# Patient Record
Sex: Male | Born: 1940 | Race: Black or African American | Hispanic: No | Marital: Married | Smoking: Never smoker
Health system: Southern US, Community
[De-identification: ages and names within clinical notes are randomized; demographics above are authoritative.]

## PROBLEM LIST (undated history)

## (undated) DIAGNOSIS — Z923 Personal history of irradiation: Secondary | ICD-10-CM

---

## 2013-07-01 ENCOUNTER — Encounter (HOSPITAL_COMMUNITY): Payer: Self-pay | Admitting: Emergency Medicine

## 2013-07-01 ENCOUNTER — Observation Stay (HOSPITAL_COMMUNITY)
Admission: EM | Admit: 2013-07-01 | Discharge: 2013-07-03 | DRG: 689 | Disposition: A | Discharge status: Home Health | Payer: Medicare Other | Attending: Internal Medicine | Admitting: Internal Medicine

## 2013-07-01 ENCOUNTER — Emergency Department (HOSPITAL_COMMUNITY): Payer: Medicare Other

## 2013-07-01 DIAGNOSIS — Z79899 Other long term (current) drug therapy: Secondary | ICD-10-CM | POA: Insufficient documentation

## 2013-07-01 DIAGNOSIS — J209 Acute bronchitis, unspecified: Secondary | ICD-10-CM | POA: Diagnosis present

## 2013-07-01 DIAGNOSIS — N390 Urinary tract infection, site not specified: Secondary | ICD-10-CM | POA: Insufficient documentation

## 2013-07-01 DIAGNOSIS — N179 Acute kidney failure, unspecified: Secondary | ICD-10-CM | POA: Diagnosis present

## 2013-07-01 DIAGNOSIS — R4189 Other symptoms and signs involving cognitive functions and awareness: Secondary | ICD-10-CM | POA: Diagnosis present

## 2013-07-01 DIAGNOSIS — G9341 Metabolic encephalopathy: Principal | ICD-10-CM | POA: Insufficient documentation

## 2013-07-01 DIAGNOSIS — R4182 Altered mental status, unspecified: Secondary | ICD-10-CM | POA: Diagnosis present

## 2013-07-01 DIAGNOSIS — D72829 Elevated white blood cell count, unspecified: Secondary | ICD-10-CM | POA: Diagnosis present

## 2013-07-01 DIAGNOSIS — Z23 Encounter for immunization: Secondary | ICD-10-CM | POA: Insufficient documentation

## 2013-07-01 DIAGNOSIS — R0602 Shortness of breath: Secondary | ICD-10-CM | POA: Diagnosis present

## 2013-07-01 LAB — COMPREHENSIVE METABOLIC PANEL
ALT: 15 U/L (ref 0–53)
Albumin: 3.8 g/dL (ref 3.5–5.2)
BUN: 23 mg/dL (ref 6–23)
CO2: 27 mEq/L (ref 19–32)
Calcium: 8.4 mg/dL (ref 8.4–10.5)
Creatinine, Ser: 1.83 mg/dL — ABNORMAL HIGH (ref 0.50–1.35)
GFR calc Af Amer: 41 mL/min — ABNORMAL LOW (ref >90)
GFR calc non Af Amer: 35 mL/min — ABNORMAL LOW (ref >90)
Glucose, Bld: 136 mg/dL — ABNORMAL HIGH (ref 70–99)

## 2013-07-01 LAB — CBC
HCT: 40.6 % (ref 39.0–52.0)
Hemoglobin: 14.6 g/dL (ref 13.0–17.0)
MCH: 32.7 pg (ref 26.0–34.0)
MCHC: 36 g/dL (ref 30.0–36.0)
MCV: 91 fL (ref 78.0–100.0)
RBC: 4.46 MIL/uL (ref 4.22–5.81)

## 2013-07-01 LAB — CG4 I-STAT (LACTIC ACID): Lactic Acid, Venous: 1.09 mmol/L (ref 0.5–2.2)

## 2013-07-01 MED ORDER — ACETAMINOPHEN 650 MG RE SUPP
650.0000 mg | Freq: Once | RECTAL | Status: AC
  Administered 2013-07-01: 650 mg via RECTAL
  Filled 2013-07-01: qty 1

## 2013-07-01 MED ORDER — DEXTROSE 5 % IV SOLN
1.0000 g | Freq: Once | INTRAVENOUS | Status: AC
  Administered 2013-07-01: 1 g via INTRAVENOUS
  Filled 2013-07-01: qty 10

## 2013-07-01 MED ORDER — SODIUM CHLORIDE 0.9 % IV BOLUS (SEPSIS)
1000.0000 mL | Freq: Once | INTRAVENOUS | Status: AC
  Administered 2013-07-01: 1000 mL via INTRAVENOUS

## 2013-07-01 MED ORDER — DEXTROSE 5 % IV SOLN
500.0000 mg | Freq: Once | INTRAVENOUS | Status: AC
  Administered 2013-07-02: 500 mg via INTRAVENOUS
  Filled 2013-07-01: qty 500

## 2013-07-01 MED ORDER — CEFTRIAXONE SODIUM 1 G IJ SOLR
1.0000 g | Freq: Once | INTRAMUSCULAR | Status: DC
  Filled 2013-07-01: qty 10

## 2013-07-01 NOTE — ED Notes (Signed)
Patient from home via [facility] with altered mental status.  Per patient's wife patient has had congestion with an unproductive cough since yesterday morning.  Wife states that patient has also been having urinary frequency that started last night.  Patient has been rambling incoherent phrases but alert to self, patient able to follow simple commands.  Wife states that patient has become more forgetful in the last 6 months.   Patient also traveled to [place] recently returning on 06/25/2013.

## 2013-07-01 NOTE — ED Provider Notes (Signed)
CSN: [id number]     Arrival date & time 07/01/13  2132 History   First MD Initiated Contact with Patient 07/01/13 2306     Chief Complaint  Patient presents with  . Altered Mental Status  . Diarrhea   (Consider location/radiation/quality/duration/timing/severity/associated sxs/prior Treatment) HPI History provided by patient and wife bedside. Cough, and congestion with productive sputum since yesterday and a fever tonight. The fever has become confused, incoherent speech and not acting himself. No rash. No abdominal pain or vomiting. Has had some loose stools tonight. No blood in stools. No chest pain. Patient denies any shortness of breath. He has had some urinary frequency since last night denies any dysuria or hematuria. No history of same. He denies any medical problems does not take any prescribed medications. Followed by Dr. Ricki Miller. Symptoms moderate to severe.  History reviewed. No pertinent past medical history. History reviewed. No pertinent past surgical history. No family history on file. History  Substance Use Topics  . Smoking status: Unknown If Ever Smoked  . Smokeless tobacco: Not on file  . Alcohol Use: Not on file    Review of Systems  Constitutional: Positive for fever and chills.  HENT: Negative for neck pain and neck stiffness.   Eyes: Negative for visual disturbance.  Respiratory: Positive for cough. Negative for shortness of breath.   Cardiovascular: Negative for chest pain.  Gastrointestinal: Negative for vomiting, abdominal pain and diarrhea.  Genitourinary: Positive for frequency.  Musculoskeletal: Negative for back pain.  Skin: Negative for rash and wound.  Neurological: Negative for seizures, syncope and headaches.  All other systems reviewed and are negative.    Allergies  Review of patient's allergies indicates no known allergies.  Home Medications   Current Outpatient Rx  Name  Route  Sig  Dispense  Refill  . guaiFENesin (MUCINEX) 600 MG 12 hr  tablet   Oral   Take 1,200 mg by mouth 2 (two) times daily.          BP 109/50  Pulse 79  Temp(Src) 101.4 F (38.6 C) (Rectal)  Resp 17  SpO2 96% Physical Exam  Constitutional: He appears well-developed and well-nourished.  HENT:  Head: Normocephalic and atraumatic.  Mouth/Throat: Oropharynx is clear and moist. No oropharyngeal exudate.  Eyes: EOM are normal. Pupils are equal, round, and reactive to light. No scleral icterus.  Neck: Normal range of motion. Neck supple.  Cardiovascular: Normal rate, regular rhythm and intact distal pulses.   Pulmonary/Chest: Effort normal and breath sounds normal. No respiratory distress. He exhibits no tenderness.  Abdominal: Soft. He exhibits no distension. There is no tenderness.  Musculoskeletal: Normal range of motion. He exhibits no edema and no tenderness.  Neurological:  Awake and alert with slow speech, slightly confused, follows commands, no focal deficits otherwise  Skin: Skin is warm and dry.    ED Course  Procedures (including critical care time) Labs Review Labs Reviewed  URINALYSIS, ROUTINE W REFLEX MICROSCOPIC - Abnormal; Notable for the following:    Protein, ur 100 (*)    All other components within normal limits  CBC - Abnormal; Notable for the following:    WBC 11.0 (*)    Platelets 145 (*)    All other components within normal limits  COMPREHENSIVE METABOLIC PANEL - Abnormal; Notable for the following:    Glucose, Bld 136 (*)    Creatinine, Ser 1.83 (*)    GFR calc non Af Amer 35 (*)    GFR calc Af Amer 41 (*)  All other components within normal limits  URINE MICROSCOPIC-ADD ON - Abnormal; Notable for the following:    Squamous Epithelial / LPF FEW (*)    Bacteria, UA FEW (*)    Casts HYALINE CASTS (*)    All other components within normal limits  GRAM STAIN  URINE CULTURE  CULTURE, BLOOD (ROUTINE X 2)  CULTURE, BLOOD (ROUTINE X 2)  CULTURE, EXPECTORATED SPUTUM-ASSESSMENT  STREP PNEUMONIAE URINARY ANTIGEN   LEGIONELLA ANTIGEN, URINE  TSH  BASIC METABOLIC PANEL  CBC  CG4 I-STAT (LACTIC ACID)   Imaging Review Dg Chest Port 1 View  07/01/2013   CLINICAL DATA:  Chest congestion with fever. Disorientation.  EXAM: PORTABLE CHEST - 1 VIEW  COMPARISON:  None.  FINDINGS: 2233 hr. Examination was repeated to include the lung bases. The heart size and mediastinal contours are normal. The lungs are clear. There is no pleural effusion or pneumothorax. No acute osseous findings are identified. Telemetry leads overlie the chest.  IMPRESSION: No active cardiopulmonary process.   Electronically Signed   By: Roxy Horseman   On: 07/01/2013 22:55   IV fluids. IV antibiotics Medicine consult and admission MDM  Diagnosis: Altered mental status, fever, elevated creatinine UA, labs, chest x-ray  Sunnie Nielsen, MD 07/02/13 [phone]

## 2013-07-02 DIAGNOSIS — N179 Acute kidney failure, unspecified: Secondary | ICD-10-CM

## 2013-07-02 DIAGNOSIS — D72829 Elevated white blood cell count, unspecified: Secondary | ICD-10-CM

## 2013-07-02 DIAGNOSIS — R0602 Shortness of breath: Secondary | ICD-10-CM | POA: Diagnosis present

## 2013-07-02 DIAGNOSIS — N390 Urinary tract infection, site not specified: Secondary | ICD-10-CM | POA: Insufficient documentation

## 2013-07-02 DIAGNOSIS — R4182 Altered mental status, unspecified: Secondary | ICD-10-CM | POA: Diagnosis present

## 2013-07-02 LAB — CBC
HCT: 43 % (ref 39.0–52.0)
MCH: 32.8 pg (ref 26.0–34.0)
MCHC: 35.8 g/dL (ref 30.0–36.0)
MCV: 91.5 fL (ref 78.0–100.0)
RBC: 4.7 MIL/uL (ref 4.22–5.81)
RDW: 14.3 % (ref 11.5–15.5)

## 2013-07-02 LAB — URINALYSIS, ROUTINE W REFLEX MICROSCOPIC
Bilirubin Urine: NEGATIVE
Hgb urine dipstick: NEGATIVE
Ketones, ur: NEGATIVE mg/dL
Leukocytes, UA: NEGATIVE
Protein, ur: 100 mg/dL — AB
Specific Gravity, Urine: 1.02 (ref 1.005–1.030)
pH: 6 (ref 5.0–8.0)

## 2013-07-02 LAB — BASIC METABOLIC PANEL
BUN: 18 mg/dL (ref 6–23)
Chloride: 100 mEq/L (ref 96–112)
Creatinine, Ser: 1.41 mg/dL — ABNORMAL HIGH (ref 0.50–1.35)
GFR calc Af Amer: 56 mL/min — ABNORMAL LOW (ref >90)
GFR calc non Af Amer: 48 mL/min — ABNORMAL LOW (ref >90)
Glucose, Bld: 105 mg/dL — ABNORMAL HIGH (ref 70–99)

## 2013-07-02 LAB — LEGIONELLA ANTIGEN, URINE: Legionella Antigen, Urine: NEGATIVE

## 2013-07-02 LAB — GRAM STAIN

## 2013-07-02 LAB — STREP PNEUMONIAE URINARY ANTIGEN: Strep Pneumo Urinary Antigen: NEGATIVE

## 2013-07-02 LAB — TSH: TSH: 0.215 u[IU]/mL — ABNORMAL LOW (ref 0.350–4.500)

## 2013-07-02 LAB — URINE MICROSCOPIC-ADD ON

## 2013-07-02 MED ORDER — GUAIFENESIN ER 600 MG PO TB12
1200.0000 mg | ORAL_TABLET | Freq: Two times a day (BID) | ORAL | Status: DC
  Administered 2013-07-02 – 2013-07-03 (×2): 1200 mg via ORAL
  Filled 2013-07-02 (×4): qty 2

## 2013-07-02 MED ORDER — DEXTROSE 5 % IV SOLN
1.0000 g | INTRAVENOUS | Status: DC
  Administered 2013-07-02 – 2013-07-03 (×2): 1 g via INTRAVENOUS
  Filled 2013-07-02 (×2): qty 10

## 2013-07-02 MED ORDER — SODIUM CHLORIDE 0.9 % IV SOLN
INTRAVENOUS | Status: DC
  Administered 2013-07-02 – 2013-07-03 (×2): via INTRAVENOUS

## 2013-07-02 MED ORDER — ALBUTEROL SULFATE (5 MG/ML) 0.5% IN NEBU: 2.5000 mg | INHALATION_SOLUTION | RESPIRATORY_TRACT | Status: DC | PRN

## 2013-07-02 MED ORDER — DEXTROSE 5 % IV SOLN
1.0000 g | Freq: Every day | INTRAVENOUS | Status: DC
  Filled 2013-07-02: qty 10

## 2013-07-02 MED ORDER — SODIUM CHLORIDE 0.9 % IJ SOLN: 3.0000 mL | Freq: Two times a day (BID) | INTRAMUSCULAR | Status: DC

## 2013-07-02 MED ORDER — ONDANSETRON HCL 4 MG PO TABS: 4.0000 mg | ORAL_TABLET | Freq: Four times a day (QID) | ORAL | Status: DC | PRN

## 2013-07-02 MED ORDER — ONDANSETRON HCL 4 MG/2ML IJ SOLN: 4.0000 mg | Freq: Four times a day (QID) | INTRAMUSCULAR | Status: DC | PRN

## 2013-07-02 MED ORDER — HYDROCODONE-ACETAMINOPHEN 5-325 MG PO TABS: 1.0000 | ORAL_TABLET | ORAL | Status: DC | PRN

## 2013-07-02 MED ORDER — ENOXAPARIN SODIUM 30 MG/0.3ML ~~LOC~~ SOLN
30.0000 mg | SUBCUTANEOUS | Status: DC
  Administered 2013-07-03: 30 mg via SUBCUTANEOUS
  Filled 2013-07-02 (×3): qty 0.3

## 2013-07-02 MED ORDER — INFLUENZA VAC SPLIT QUAD 0.5 ML IM SUSP
0.5000 mL | INTRAMUSCULAR | Status: AC
  Administered 2013-07-03: 0.5 mL via INTRAMUSCULAR
  Filled 2013-07-02: qty 0.5

## 2013-07-02 MED ORDER — PNEUMOCOCCAL VAC POLYVALENT 25 MCG/0.5ML IJ INJ
0.5000 mL | INJECTION | INTRAMUSCULAR | Status: AC
  Administered 2013-07-03: 0.5 mL via INTRAMUSCULAR
  Filled 2013-07-02: qty 0.5

## 2013-07-02 MED ORDER — AZITHROMYCIN 500 MG PO TABS
500.0000 mg | ORAL_TABLET | ORAL | Status: DC
  Administered 2013-07-02 – 2013-07-03 (×2): 500 mg via ORAL
  Filled 2013-07-02: qty 1
  Filled 2013-07-02: qty 2
  Filled 2013-07-02 (×2): qty 1

## 2013-07-02 NOTE — Progress Notes (Signed)
Utilization Review Completed.Richard Barr T9/23/2014  

## 2013-07-02 NOTE — H&P (Signed)
[facility] History and Physical  Richard Barr ZOX:[id number] DOB: [birth year] DOA: 07/01/2013  Referring physician: ED physician PCP: Juline Patch, MD   Chief Complaint: altered mental status   HPI:  Pt is 72 yo male with no specific medical history who presents to [facility] ED with main concern of fevers, productive cough of yellow sputum, exertional shortness of breath that initially started several days prior to this admission and has been getting worse. Per wife, pt also appeared to be confused and has not been like his typical self. She has not noticed any specific abdominal or focal neurological symptoms, no chest pain, no similar events in the past, no recent sick contacts or exposures. Sh ehas noted urinary urgency and frequency.  In ED, pt stable and maintaining oxygen saturations at target range. [facility] asked to admit for observation and IV ABX.  Assessment and Plan:  Principal Problem:   Altered mental state - unclear etiology and possibly related to viral URI, ?PNA even though not evident on CXR, ? UTI - will admit to telemetry bed for now - order blood and urine culture, check TSH, 12 lead EKG - bed rest for now, IVF, PT evaluation once pt more medically stable  Active Problems:   ARF (acute renal failure) - likely pre renal and secondary to poor oral intake - will place on IVF and repeat BMP in AM   Leukocytosis - possibly secondary to UTI, ? PNA - ABX Zithromax and Rocephin for presumptive PNA and UTI - repeat CBC in AM   UTI (lower urinary tract infection) - follow upon urine culture, Rocephin for now   Shortness of breath - ? PNA, ABX as noted above - sputum analysis ordered, strep pneumo and urine legionella   Code Status: Full Family Communication: Pt and wife at bedside Disposition Plan: Admit to telemetry bed   Review of Systems:  Constitutional: Negative for diaphoresis.  HENT: Negative for hearing loss, ear pain, nosebleeds, congestion, sore throat, neck  pain, tinnitus and ear discharge.   Eyes: Negative for blurred vision, double vision, photophobia, pain, discharge and redness.  Respiratory: Negative for hemoptysis, sputum production, wheezing and stridor.   Cardiovascular: Negative for palpitations, orthopnea, claudication and leg swelling.  Gastrointestinal: Negative for nausea, vomiting and abdominal pain. Negative for heartburn, constipation, blood in stool and melena.  Genitourinary: Negative for dysuria, hematuria and flank pain.  Musculoskeletal: Negative for myalgias, back pain, joint pain and falls.  Skin: Negative for itching and rash.  Neurological: Negative for tingling, tremors, sensory change, speech change, focal weakness, loss of consciousness and headaches.  Endo/Heme/Allergies: Negative for environmental allergies and polydipsia. Does not bruise/bleed easily.  Psychiatric/Behavioral: Negative for suicidal ideas. The patient is not nervous/anxious.      History reviewed. No pertinent past medical history.  History reviewed. No pertinent past surgical history.  Social History:  has no tobacco, alcohol, and drug history on file.  No Known Allergies  No known family medical history   Prior to Admission medications   Medication Sig Start Date End Date Taking? Authorizing Provider  guaiFENesin (MUCINEX) 600 MG 12 hr tablet Take 1,200 mg by mouth 2 (two) times daily.   Yes Historical Provider, MD    Physical Exam: Filed Vitals:   07/01/13 2214 07/01/13 2215 07/01/13 2331 07/02/13 0000  BP: 106/53 109/50 105/60 106/46  Pulse: 82 79 54 58  Temp:      TempSrc:      Resp:  17 27 26   SpO2: 97% 96% 96% 96%  Physical Exam  Constitutional: Appears well-developed and well-nourished. No distress.  HENT: Normocephalic. External right and left ear normal. Dry MM Eyes: Conjunctivae and EOM are normal. PERRLA, no scleral icterus.  Neck: Normal ROM. Neck supple. No JVD. No tracheal deviation. No thyromegaly.  CVS: RRR,  S1/S2 +, no murmurs, no gallops, no carotid bruit.  Pulmonary: Effort and breath sounds normal, no stridor, diminished breath sounds at bases   Abdominal: Soft. BS +,  no distension, tenderness, rebound or guarding.  Musculoskeletal: Normal range of motion. No edema and no tenderness.  Lymphadenopathy: No lymphadenopathy noted, cervical, inguinal. Neuro: Alert. Normal reflexes, muscle tone coordination. No cranial nerve deficit. Skin: Skin is warm and dry. No rash noted. Not diaphoretic. No erythema. No pallor.  Psychiatric: Normal mood and affect. Behavior, judgment, thought content normal.   Labs on Admission:  Basic Metabolic Panel:  Recent Labs Lab 07/01/13 2224  NA 135  K 4.3  CL 99  CO2 27  GLUCOSE 136*  BUN 23  CREATININE 1.83*  CALCIUM 8.4   Liver Function Tests:  Recent Labs Lab 07/01/13 2224  AST 23  ALT 15  ALKPHOS 42  BILITOT 0.3  PROT 7.4  ALBUMIN 3.8   CBC:  Recent Labs Lab 07/01/13 2224  WBC 11.0*  HGB 14.6  HCT 40.6  MCV 91.0  PLT 145*   Radiological Exams on Admission: Dg Chest Port 1 View  07/01/2013   CLINICAL DATA:  Chest congestion with fever. Disorientation.  EXAM: PORTABLE CHEST - 1 VIEW  COMPARISON:  None.  FINDINGS: 2233 hr. Examination was repeated to include the lung bases. The heart size and mediastinal contours are normal. The lungs are clear. There is no pleural effusion or pneumothorax. No acute osseous findings are identified. Telemetry leads overlie the chest.  IMPRESSION: No active cardiopulmonary process.   Electronically Signed   By: Roxy Horseman   On: 07/01/2013 22:55    EKG: Normal sinus rhythm, no ST/T wave changes  Debbora Presto, MD  [facility] Pager [phone]  If 7PM-7AM, please contact night-coverage [email or web address] Password [email or web address] 07/02/2013, 1:05 AM

## 2013-07-02 NOTE — Evaluation (Signed)
Physical Therapy Evaluation Patient Details Name: Richard Barr MRN: [id number] DOB: 1941/07/25 Today's Date: 07/02/2013 Time: 4098-1191 PT Time Calculation (min): 25 min  PT Assessment / Plan / Recommendation History of Present Illness  Pt is 72 yo male with no specific medical history who presents to [facility] ED with main concern of fevers, productive cough of yellow sputum, exertional shortness of breath that initially started several days prior to this admission and has been getting worse. Per wife, pt also appeared to be confused and has not been like his typical self. She has not noticed any specific abdominal or focal neurological symptoms, no chest pain, no similar events in the past, no recent sick contacts or exposures. Sh ehas noted urinary urgency and frequency  Clinical Impression  Pt sitting on side of bed on arrival despite bed alarm going off. Pt pleasant gentleman from [place] in the states 61yrs who is currently confused and using wide range of verbage but limited with cognition. Pt stating things like "what are the proprietary events of this establishment?" when instructed to have assist for mobility. Pt disoriented to day, month and situation and unable to follow all commands for balance testing. Asked pt to turn in a circle, he asked in which direction, stated he would turn left and proceeded to walk forward. Pt most likely near baseline functional mobility but unable to confirm all balance due to cognition. No CT performed to determine if acute event. Pt will benefit from acute therapy to maximize mobility and function for safe discharge.     PT Assessment  Patient needs continued PT services    Follow Up Recommendations  Supervision/Assistance - 24 hour    Does the patient have the potential to tolerate intense rehabilitation      Barriers to Discharge        Equipment Recommendations  None recommended by PT    Recommendations for Other Services Speech consult-  cognitive/linguistic;OT consult   Frequency Min 3X/week    Precautions / Restrictions Precautions Precautions: Fall Restrictions Weight Bearing Restrictions: No   Pertinent Vitals/Pain No pain VSS      Mobility  Bed Mobility Bed Mobility: Supine to Sit;Sit to Supine Supine to Sit: 6: Modified independent (Device/Increase time);HOB flat Sit to Supine: 6: Modified independent (Device/Increase time);HOB flat Transfers Transfers: Sit to Stand;Stand to Sit Sit to Stand: 6: Modified independent (Device/Increase time);From bed;From chair/3-in-1 Stand to Sit: 6: Modified independent (Device/Increase time);To chair/3-in-1;To bed Ambulation/Gait Ambulation/Gait Assistance: 5: Supervision Ambulation Distance (Feet): 300 Feet Assistive device: None Ambulation/Gait Assistance Details: pt with supervision for safety, directional cues and recall of room number Gait Pattern: Step-through pattern;Decreased stride length Gait velocity: WFL Stairs: Yes Stairs Assistance: 5: Supervision Stairs Assistance Details (indicate cue type and reason): supervision for safety due to cognition Stair Management Technique: One rail Right;Forwards;Alternating pattern Number of Stairs: 11 Modified Rankin (Stroke Patients Only) Pre-Morbid Rankin Score: No symptoms Modified Rankin: Slight disability    Exercises     PT Diagnosis: Altered mental status  PT Problem List: Decreased cognition;Decreased coordination;Decreased activity tolerance;Decreased safety awareness PT Treatment Interventions: Functional mobility training;Therapeutic activities;Patient/family education;Balance training     PT Goals(Current goals can be found in the care plan section) Acute Rehab PT Goals Patient Stated Goal: return home PT Goal Formulation: With patient Time For Goal Achievement: 07/16/13 Potential to Achieve Goals: Good  Visit Information  Last PT Received On: 07/02/13 Assistance Needed: +1 History of Present  Illness: Pt is 72 yo male with no specific medical history  who presents to [facility] ED with main concern of fevers, productive cough of yellow sputum, exertional shortness of breath that initially started several days prior to this admission and has been getting worse. Per wife, pt also appeared to be confused and has not been like his typical self. She has not noticed any specific abdominal or focal neurological symptoms, no chest pain, no similar events in the past, no recent sick contacts or exposures. Sh ehas noted urinary urgency and frequency       Prior Functioning  Home Living Family/patient expects to be discharged to:: Private residence Living Arrangements: Spouse/significant other Available Help at Discharge: Available 24 hours/day Type of Home: Apartment Home Access: Level entry Home Layout: Two level Alternate Level Stairs-Number of Steps: 12 Alternate Level Stairs-Rails: Right Home Equipment: None Prior Function Level of Independence: Independent Communication Communication: No difficulties    Cognition  Cognition Arousal/Alertness: Awake/alert Behavior During Therapy: WFL for tasks assessed/performed Overall Cognitive Status: Impaired/Different from baseline Area of Impairment: Orientation;Memory;Problem solving;Awareness Orientation Level: Time;Situation Memory: Decreased short-term memory Problem Solving: Difficulty sequencing;Decreased initiation;Slow processing General Comments: pt with expansive vocabulary suggesting highly educated however verbage but out of context and not able to identify errors    Extremity/Trunk Assessment Upper Extremity Assessment Upper Extremity Assessment: Overall WFL for tasks assessed Lower Extremity Assessment Lower Extremity Assessment: Overall WFL for tasks assessed Cervical / Trunk Assessment Cervical / Trunk Assessment: Normal   Balance Balance Balance Assessed: Yes Static Sitting Balance Static Sitting - Balance Support: Feet  supported;No upper extremity supported Static Sitting - Level of Assistance: 7: Independent Static Sitting - Comment/# of Minutes: 4 Static Standing Balance Static Standing - Level of Assistance: 6: Modified independent (Device/Increase time) Static Standing - Comment/# of Minutes: 3 Single Leg Stance - Right Leg: 10 Single Leg Stance - Left Leg: 10  End of Session PT - End of Session Equipment Utilized During Treatment: Gait belt Activity Tolerance: Patient tolerated treatment well Patient left: in bed;with bed alarm set;with call bell/phone within reach Nurse Communication: Mobility status  GP     Delorse Lek 07/02/2013, 11:49 AM  Delaney Meigs, PT [phone]

## 2013-07-02 NOTE — Progress Notes (Signed)
[facility] PROGRESS NOTE  Richard Barr ZOX:[id number] DOB: 01-01-1941 DOA: 07/01/2013 PCP: Juline Patch, MD Brief HPI:  Pt is 72 yo male with no specific medical history who presents to [facility] ED with main concern of fevers, productive cough of yellow sputum, exertional shortness of breath that initially started several days prior to this admission and has been getting worse. Per wife, pt also appeared to be confused and has not been like his typical self. She has not noticed any specific abdominal or focal neurological symptoms, no chest pain, no similar events in the past, no recent sick contacts or exposures. Sh ehas noted urinary urgency and frequency.  In ED, pt stable and maintaining oxygen saturations at target range. TR[facility] asked to admit for observation and IV ABX  Assessment/Plan: Altered mental state  Resolved. Possibly from dehydration and possible early pneumonia.  - blood cultures pending. Urine cultures pending. CXR did not show pneumonia.   ARF (acute renal failure)  - likely pre renal and secondary to poor oral intake  - will place on IVF and repeat BMP in AM shows improvement .   Leukocytosis  - possibly secondary to early PNA - ABX Zithromax and Rocephin for presumptive PNA and UTI  - repeat CBC in AM shows resolution of the leukocytosis.  UTI (lower urinary tract infection)  - follow upon urine culture, Rocephin for now   Shortness of breath  - ? PNA, ABX as noted above  - sputum analysis ordered, strep pneumo and urine legionella      Code Status: full code Family Communication: wife at bedside Disposition Plan: pending   Consultants:  none  Procedures:  none  Antibiotics:  Rocephin and zithromax.   HPI/Subjective: Feeling better.   Objective: Filed Vitals:   07/02/13 1350  BP: 116/71  Pulse: 69  Temp: 98.9 F (37.2 C)  Resp: 18    Intake/Output Summary (Last 24 hours) at 07/02/13 1634 Last data filed at 07/02/13 1600  Gross per 24  hour  Intake    480 ml  Output   1401 ml  Net   -921 ml   Filed Weights   07/02/13 0536  Weight: 66.7 kg (147 lb 0.8 oz)    Exam:   General:  Alert afebrile comfortable  Cardiovascular: s1s2  Respiratory: CTAB  Abdomen: SOFT NT ND BS+  Musculoskeletal: no pedal edema   Data Reviewed: Basic Metabolic Panel:  Recent Labs Lab 07/01/13 2224 07/02/13 0740  NA 135 139  K 4.3 3.9  CL 99 100  CO2 27 28  GLUCOSE 136* 105*  BUN 23 18  CREATININE 1.83* 1.41*  CALCIUM 8.4 8.3*   Liver Function Tests:  Recent Labs Lab 07/01/13 2224  AST 23  ALT 15  ALKPHOS 42  BILITOT 0.3  PROT 7.4  ALBUMIN 3.8   No results found for this basename: LIPASE, AMYLASE,  in the last 168 hours No results found for this basename: AMMONIA,  in the last 168 hours CBC:  Recent Labs Lab 07/01/13 2224 07/02/13 0740  WBC 11.0* 9.8  HGB 14.6 15.4  HCT 40.6 43.0  MCV 91.0 91.5  PLT 145* 134*   Cardiac Enzymes: No results found for this basename: CKTOTAL, CKMB, CKMBINDEX, TROPONINI,  in the last 168 hours BNP (last 3 results) No results found for this basename: PROBNP,  in the last 8760 hours CBG: No results found for this basename: GLUCAP,  in the last 168 hours  Recent Results (from the past 240 hour(s))  GRAM  STAIN     Status: None   Collection Time    07/01/13 11:26 PM      Result Value Range Status   Specimen Description URINE, RANDOM   Final   Special Requests NONE   Final   Gram Stain     Final   Value: CYTOSPIN     WBC PRESENT,BOTH PMN AND MONONUCLEAR     GRAM POSITIVE RODS   Report Status 07/02/2013 FINAL   Final     Studies: Dg Chest Port 1 View  07/01/2013   CLINICAL DATA:  Chest congestion with fever. Disorientation.  EXAM: PORTABLE CHEST - 1 VIEW  COMPARISON:  None.  FINDINGS: 2233 hr. Examination was repeated to include the lung bases. The heart size and mediastinal contours are normal. The lungs are clear. There is no pleural effusion or pneumothorax. No acute  osseous findings are identified. Telemetry leads overlie the chest.  IMPRESSION: No active cardiopulmonary process.   Electronically Signed   By: Roxy Horseman   On: 07/01/2013 22:55    Scheduled Meds: . azithromycin  500 mg Oral Q24H  . cefTRIAXone (ROCEPHIN)  IV  1 g Intravenous Q24H  . enoxaparin (LOVENOX) injection  30 mg Subcutaneous Q24H  . guaiFENesin  1,200 mg Oral BID  . [START ON 07/03/2013] influenza vac split quadrivalent PF  0.5 mL Intramuscular Tomorrow-1000  . [START ON 07/03/2013] pneumococcal 23 valent vaccine  0.5 mL Intramuscular Tomorrow-1000  . sodium chloride  3 mL Intravenous Q12H   Continuous Infusions: . sodium chloride 75 mL/hr at 07/02/13 0345    Principal Problem:   Altered mental state Active Problems:   ARF (acute renal failure)   Leukocytosis   UTI (lower urinary tract infection)   Shortness of breath    Time spent: 25 min    Jaquanda Wickersham  [facility] Pager [phone] If 7PM-7AM, please contact night-coverage at [email or web address], password [email or web address] 07/02/2013, 4:34 PM  LOS: 1 day

## 2013-07-02 NOTE — Care Management Note (Signed)
    Page 1 of 2   07/04/2013     4:08:01 PM   CARE MANAGEMENT NOTE 07/04/2013  Patient:  Richard Barr, Richard Barr   Account Number:  [id number]  Date Initiated:  07/02/2013  Documentation initiated by:  Derian Dimalanta  Subjective/Objective Assessment:   PT ADM ON 07/01/13 WITH AMS, UTI, ARF.  PTA, PT RESIDES AT HOME WITH SIGNIFICANT OTHER.     Action/Plan:   WILL FOLLOW FOR DISCHARGE NEEDS AS PT PROGRESSES.  PT/OT CONSULTS PENDING.   Anticipated DC Date:  07/05/2013   Anticipated DC Plan:  HOME W HOME HEALTH SERVICES      DC Planning Services  CM consult      [facility] Choice  HOME HEALTH   Choice offered to / List presented to:  C-3 Spouse        HH arranged  HH-1 RN  HH-2 PT  HH-3 OT  HH-4 NURSE'S AIDE      HH agency  [facility]   Status of service:  Completed, signed off Medicare Important Message given?   (If response is "NO", the following Medicare IM given date fields will be blank) Date Medicare IM given:   Date Additional Medicare IM given:    Discharge Disposition:  HOME W HOME HEALTH SERVICES  Per UR Regulation:  Reviewed for med. necessity/level of care/duration of stay  If discussed at Long Length of Stay Meetings, dates discussed:    Comments:  07/04/13 Angelly Spearing,RN,BSN [phone] SPOKE WITH WIFE, JANET FOR DECISON ON HOMECARE:  WIFE WISHES TO USE [facility] FOR HOME CARE NEEDS.  REFERRAL TO [facility], PER PT CHOICE.  [facility] GIVEN WIFE'S PHONE # OF [phone] TO CALL FOR APPT.  07/03/13 Zelena Bushong,RN,BSN [phone] PT NEEDS HH FOLLOW UP; PT LEFT PRIOR TO CASE MANAGER BEING ABLE TO VISIT TO SET UP HH.  SPOKE WITH WIFE, JANET BY PHONE, AND FAXED [facility] LIST TO HER FOR REVIEW.  SHE ASKS THAT I CALL HER BACK IN THE AM FOR HER DECISION.  WILL FOLLOW UP.

## 2013-07-03 DIAGNOSIS — R4189 Other symptoms and signs involving cognitive functions and awareness: Secondary | ICD-10-CM | POA: Diagnosis present

## 2013-07-03 DIAGNOSIS — J209 Acute bronchitis, unspecified: Secondary | ICD-10-CM | POA: Diagnosis present

## 2013-07-03 DIAGNOSIS — F09 Unspecified mental disorder due to known physiological condition: Secondary | ICD-10-CM

## 2013-07-03 LAB — URINE CULTURE
Colony Count: NO GROWTH
Culture: NO GROWTH

## 2013-07-03 MED ORDER — LEVOFLOXACIN 500 MG PO TABS: 500.0000 mg | ORAL_TABLET | Freq: Every day | ORAL | Status: DC

## 2013-07-03 NOTE — Progress Notes (Signed)
Physical Therapy Treatment Patient Details Name: Richard Barr MRN: [id number] DOB: 11-Jul-1941 Today's Date: 07/03/2013 Time: 7829-5621 PT Time Calculation (min): 23 min  PT Assessment / Plan / Recommendation  History of Present Illness Pt is 72 yo male with no specific medical history who presents to [facility] ED with main concern of fevers, productive cough of yellow sputum, exertional shortness of breath that initially started several days prior to this admission and has been getting worse. Per wife, pt also appeared to be confused and has not been like his typical self. She has not noticed any specific abdominal or focal neurological symptoms, no chest pain, no similar events in the past, no recent sick contacts or exposures. Sh ehas noted urinary urgency and frequency   PT Comments   Pt presents with improved cognition this session. Balance assessed and is borderline but falls outside overt fall risk.  Pt instructed on basic home exercise in additional to continued participation in daily walking and exercise from previous to admission.    Follow Up Recommendations   Home with 24 hour supervision     Does the patient have the potential to tolerate intense rehabilitation     Barriers to Discharge        Equipment Recommendations       Recommendations for Other Services    Frequency Min 3X/week   Progress towards PT Goals Progress towards PT goals: Progressing toward goals  Plan Current plan remains appropriate    Precautions / Restrictions Precautions Precautions: Fall Precaution Comments: mild LE weakness   Pertinent Vitals/Pain no apparent distress     Mobility  Bed Mobility Bed Mobility: Not assessed Transfers Transfers: Stand to Sit;Sit to Stand Sit to Stand: 6: Modified independent (Device/Increase time);Without upper extremity assist;From bed Stand to Sit: 6: Modified independent (Device/Increase time);Without upper extremity assist;To bed    Exercises Other  Exercises Other Exercises: standing supported single leg stance; standing supported bilpedal heel raises   PT Diagnosis:    PT Problem List:   PT Treatment Interventions:     PT Goals (current goals can now be found in the care plan section)    Visit Information  Last PT Received On: 07/03/13 Assistance Needed: +1 History of Present Illness: Pt is 72 yo male with no specific medical history who presents to [facility] ED with main concern of fevers, productive cough of yellow sputum, exertional shortness of breath that initially started several days prior to this admission and has been getting worse. Per wife, pt also appeared to be confused and has not been like his typical self. She has not noticed any specific abdominal or focal neurological symptoms, no chest pain, no similar events in the past, no recent sick contacts or exposures. Sh ehas noted urinary urgency and frequency    Subjective Data  Subjective: I am going home today   Cognition  Cognition Arousal/Alertness: Awake/alert Behavior During Therapy: WFL for tasks assessed/performed Overall Cognitive Status: Difficult to assess Area of Impairment: Safety/judgement Safety/Judgement: Decreased awareness of deficits (unclear if pt fully appreciates HIS abilities and risks) General Comments: appears to be less confused today with continued non-contextually approprate language, however easily redirected to answer or self-assess appropriately.  Cooperative.      Balance  Balance Balance Assessed: Yes Static Standing Balance Single Leg Stance - Right Leg: 10 (does struggle to attain and maintain) Rhomberg - Eyes Opened: 30 Rhomberg - Eyes Closed: 30  End of Session PT - End of Session Activity Tolerance: Patient tolerated treatment well  Patient left: in bed;with call bell/phone within reach Nurse Communication: Mobility status   GP     Dennis Bast 07/03/2013, 12:05 PM

## 2013-07-03 NOTE — Progress Notes (Signed)
Assessment unchanged. Discussed discharge instructions with patient and family including new medications and follow up appointments. IV and tel removed. Patient left with belongings accompanied by volunteer via wheelchair.

## 2013-07-03 NOTE — Clinical Documentation Improvement (Signed)
THIS DOCUMENT IS NOT A PERMANENT PART OF THE MEDICAL RECORD  Please update your documentation with the medical record to reflect your response to this query. If you need help knowing how to do this please call [phone].  07/03/13  Dear Dr. Jomarie Longs Marton Redwood  In an effort to better capture your patient's severity of illness, reflect appropriate length of stay and utilization of resources, a review of the patient medical record has revealed the following indicators.    Based on your clinical judgment, please clarify and document in a progress note and/or discharge summary the clinical condition associated with the following supporting information:   Possible Clinical Conditions?  "      Encephalopathy (describe type if known)                       Anoxic                       Septic                       Alcoholic                        Hepatic                       Hypertensive                       Metabolic                       Toxic "      Drug induced confusion/delirium "      Acute confusion "      Acute delirium "      New diagnosis of Dementia, Alzheimer's, cerebral atherosclerosis "      Other Condition "      Cannot Clinically Determine     Risk Factors:  AMS, unclear etiology and possibly related to viral URI, noted per 9/23(0128) progress notes. Patient confused, incoherent speech, AMS, noted per 9/23(0612) progress notes. AMS resolved, possibly from dehydration and early pneumonia, noted per 1/91(4782) progress notes.   Reviewed:   No additional documentation provided.                             Thank You,  Marciano Sequin,  Clinical Documentation Specialist: [phone] Health Information Management [place]

## 2013-07-04 NOTE — Discharge Summary (Signed)
Physician Discharge Summary  Chase Arnall ZOX:[id number] DOB: October 07, 1941 DOA: 07/01/2013  PCP: Juline Patch, MD  Admit date: 07/01/2013 Discharge date: 07/03/2013  Time spent:45 minutes  Recommendations for Outpatient Follow-up:  1. PCP in 1 week, please FU free T4, if low will need to start low dose Synthroid 2. Home health PT/OT/aide 2. Will need NEuro-psychiatric evaluation down the road to evaluate for Early Dementia vs Mild Cognitive dysfunction  Discharge Diagnoses:    Metabolic encephalopathy   Acute Bronchitis   ARF (acute renal failure)   Leukocytosis   Shortness of breath   Cognitive deficits   Discharge Condition: stable  Diet recommendation: regular  Filed Weights   07/02/13 0536 07/03/13 0449  Weight: 66.7 kg (147 lb 0.8 oz) 64.139 kg (141 lb 6.4 oz)    History of present illness:  Pt is 72 yo male with no specific medical history who presents to [facility] ED with main concern of fevers, productive cough of yellow sputum, exertional shortness of breath that initially started several days prior to this admission and has been getting worse. Per wife, pt also appeared to be confused and has not been like his typical self.  Hospital Course:   Acute Bronchitis  He was treated with IV rocephin and Zithromax for Acute bronchitis vs UTI.  His urine and blood cultures came back negative. -     He continued to have productive cough, but this improved with antibiotics and mucolytic's. -     His CXR was negative for pneumonia  Cognitive deficitis  -in addition through the hospitalization he was noted to have mild cognitive dysfunction vs early signs of Dementia, hence he will need more support and supervision at home. -this has been d/w Wife in detail -His TSH was low, free T4 pending at discharge, this will need to be followed upon and managed accordingly.  Discharge Exam: Filed Vitals:   07/03/13 0449  BP: 126/66  Pulse: 63  Temp: 98 F (36.7 C)  Resp: 20     General: Alert, awake, oriented to self, place and partly to time Cardiovascular: S1s2/RRR Respiratory: CTAB  Discharge Instructions  Discharge Orders   Future Orders Complete By Expires   Increase activity slowly  As directed        Medication List         guaiFENesin 600 MG 12 hr tablet  Commonly known as:  MUCINEX  Take 1,200 mg by mouth 2 (two) times daily.     levofloxacin 500 MG tablet  Commonly known as:  LEVAQUIN  Take 1 tablet (500 mg total) by mouth daily. For 5 days       No Known Allergies     Follow-up Information   Follow up with PANG,RICHARD, MD In 1 week.   Specialty:  Internal Medicine   Contact information:   [address], [address] [place] [address] [phone]        The results of significant diagnostics from this hospitalization (including imaging, microbiology, ancillary and laboratory) are listed below for reference.    Significant Diagnostic Studies: Dg Chest Port 1 View  07/01/2013   CLINICAL DATA:  Chest congestion with fever. Disorientation.  EXAM: PORTABLE CHEST - 1 VIEW  COMPARISON:  None.  FINDINGS: 2233 hr. Examination was repeated to include the lung bases. The heart size and mediastinal contours are normal. The lungs are clear. There is no pleural effusion or pneumothorax. No acute osseous findings are identified. Telemetry leads overlie the chest.  IMPRESSION: No active cardiopulmonary  process.   Electronically Signed   By: Roxy Horseman   On: 07/01/2013 22:55    Microbiology: Recent Results (from the past 240 hour(s))  URINE CULTURE     Status: None   Collection Time    07/01/13 11:26 PM      Result Value Range Status   Specimen Description URINE, RANDOM   Final   Special Requests NONE   Final   Culture  Setup Time     Final   Value: 07/02/2013 04:55     Performed at [facility]   Colony Count     Final   Value: NO GROWTH     Performed at [facility]   Culture     Final   Value: NO  GROWTH     Performed at [facility]   Report Status 07/03/2013 FINAL   Final  GRAM STAIN     Status: None   Collection Time    07/01/13 11:26 PM      Result Value Range Status   Specimen Description URINE, RANDOM   Final   Special Requests NONE   Final   Gram Stain     Final   Value: CYTOSPIN     WBC PRESENT,BOTH PMN AND MONONUCLEAR     GRAM POSITIVE RODS   Report Status 07/02/2013 FINAL   Final  CULTURE, BLOOD (ROUTINE X 2)     Status: None   Collection Time    07/02/13  1:50 AM      Result Value Range Status   Specimen Description BLOOD LEFT ARM   Final   Special Requests     Final   Value: BOTTLES DRAWN AEROBIC AND ANAEROBIC 10CC EACH,PT ON ZITHROMAX   Culture  Setup Time     Final   Value: 07/02/2013 08:08     Performed at [facility]   Culture     Final   Value:        BLOOD CULTURE RECEIVED NO GROWTH TO DATE CULTURE WILL BE HELD FOR 5 DAYS BEFORE ISSUING A FINAL NEGATIVE REPORT     Performed at [facility]   Report Status PENDING   Incomplete  CULTURE, BLOOD (ROUTINE X 2)     Status: None   Collection Time    07/02/13  2:00 AM      Result Value Range Status   Specimen Description BLOOD LEFT HAND   Final   Special Requests BOTTLES DRAWN AEROBIC ONLY 10CC,PT ON ZITHROMAX   Final   Culture  Setup Time     Final   Value: 07/02/2013 08:08     Performed at [facility]   Culture     Final   Value:        BLOOD CULTURE RECEIVED NO GROWTH TO DATE CULTURE WILL BE HELD FOR 5 DAYS BEFORE ISSUING A FINAL NEGATIVE REPORT     Performed at [facility]   Report Status PENDING   Incomplete     Labs: Basic Metabolic Panel:  Recent Labs Lab 07/01/13 2224 07/02/13 0740  NA 135 139  K 4.3 3.9  CL 99 100  CO2 27 28  GLUCOSE 136* 105*  BUN 23 18  CREATININE 1.83* 1.41*  CALCIUM 8.4 8.3*   Liver Function Tests:  Recent Labs Lab 07/01/13 2224  AST 23  ALT 15  ALKPHOS 42  BILITOT 0.3  PROT 7.4  ALBUMIN 3.8   No results  found for this basename:  LIPASE, AMYLASE,  in the last 168 hours No results found for this basename: AMMONIA,  in the last 168 hours CBC:  Recent Labs Lab 07/01/13 2224 07/02/13 0740  WBC 11.0* 9.8  HGB 14.6 15.4  HCT 40.6 43.0  MCV 91.0 91.5  PLT 145* 134*   Cardiac Enzymes: No results found for this basename: CKTOTAL, CKMB, CKMBINDEX, TROPONINI,  in the last 168 hours BNP: BNP (last 3 results) No results found for this basename: PROBNP,  in the last 8760 hours CBG: No results found for this basename: GLUCAP,  in the last 168 hours     Signed:  Cheryl Stabenow  [facility] 07/04/2013, 2:19 PM

## 2013-07-08 LAB — CULTURE, BLOOD (ROUTINE X 2): Culture: NO GROWTH

## 2014-10-01 ENCOUNTER — Other Ambulatory Visit: Payer: Self-pay | Admitting: Internal Medicine

## 2014-10-01 DIAGNOSIS — R7989 Other specified abnormal findings of blood chemistry: Secondary | ICD-10-CM

## 2014-10-06 ENCOUNTER — Ambulatory Visit
Admission: RE | Admit: 2014-10-06 | Discharge: 2014-10-06 | Disposition: A | Discharge status: Home / Self Care | Payer: Medicare Other | Source: Ambulatory Visit | Attending: Internal Medicine | Admitting: Internal Medicine

## 2014-10-06 DIAGNOSIS — R7989 Other specified abnormal findings of blood chemistry: Secondary | ICD-10-CM

## 2015-01-01 ENCOUNTER — Ambulatory Visit: Payer: Medicare Other

## 2015-01-01 ENCOUNTER — Ambulatory Visit: Payer: Medicare Other | Admitting: Radiation Oncology

## 2015-01-16 ENCOUNTER — Encounter: Payer: Self-pay | Admitting: Radiation Oncology

## 2015-01-16 NOTE — Progress Notes (Signed)
GU Location of Tumor / Histology: Adenocarcinoma of the Prostate    If Prostate Cancer, Gleason Score is (3 + 3) and PSA is (9.7)   Mr. Ryken Paschal Presentation:      Past/Anticipated interventions by urology, if any: Biospy of Prostate - Dr. Annie Main Dalhstedt  Past/Anticipated interventions by medical oncology, if any: Unknown  Weight changes, if any: none stable  Bowel/Bladder complaints, if any: None  Stated,     Nausea/Vomiting, if any: none  Pain issues, if any:  none  SAFETY ISSUES:No  Prior radiation? No  Pacemaker/ICD? No  Possible current pregnancy? N/A  Is the patient on methotrexate? No  Current Complaints / other details: Married, 2 son 1 daughter,

## 2015-01-20 ENCOUNTER — Ambulatory Visit
Admission: RE | Admit: 2015-01-20 | Discharge: 2015-01-20 | Disposition: A | Discharge status: Home / Self Care | Payer: Medicare Other | Source: Ambulatory Visit | Attending: Radiation Oncology | Admitting: Radiation Oncology

## 2015-01-20 ENCOUNTER — Encounter: Payer: Self-pay | Admitting: Radiation Oncology

## 2015-01-20 VITALS — BP 145/75 | HR 79 | Temp 97.8°F | Resp 20 | Ht 68.0 in | Wt 172.3 lb

## 2015-01-20 DIAGNOSIS — C61 Malignant neoplasm of prostate: Secondary | ICD-10-CM

## 2015-01-20 NOTE — Progress Notes (Signed)
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 male who is seen today through the courtesy of Dr. 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  He apparently had an elevated PSA of 9.8 in October 2014, but the patient could not be reached for  follow-up.  A subsequent PSA on 08/29/2014 was 9.7 and he saw Dr. Diona Fanti who performed ultrasound-guided biopsies on 11/12/2014 revealing Gleason 6 (3+3) involving 20% of one core from right lateral mid gland, and 10% of one core from the right lateral apex.  He was found to have Gleason 7 (3+4) involving 20% of one core from the left apex.  His prostate volume was 31.5 cc.  He is doing well from a GU and GI standpoint.  His I PSS score is 2.  He has erectile dysfunction and is not sexually active.  Of note is that he has had some type of cognitive decline over the past couple of years and is taking Namenda.  PREVIOUS RADIATION THERAPY: No   PAST MEDICAL HISTORY:  has no past medical history on file.     PAST SURGICAL HISTORY: No past surgical history on file.   FAMILY HISTORY: family history includes Diabetes in his brother; Kidney failure in his brother; Other (age of onset: 11) in his father; Stroke (age of onset: 31) in his mother. No family history of prostate cancer.   SOCIAL HISTORY:  reports that he has never smoked. He does not have any smokeless tobacco history on file. He reports that he does not drink alcohol or use illicit drugs. Married, 3 children.  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  His wife is the sister of Dr. Warren Danes   ALLERGIES: Review of patient's allergies indicates no known allergies.   MEDICATIONS:  Current Outpatient Prescriptions  Medication Sig Dispense Refill  . Ascorbic Acid (VITAMIN C) 100 MG tablet Take 100 mg by mouth daily.    Marland Kitchen aspirin 81 MG tablet Take 81 mg by mouth daily.    . cholecalciferol (VITAMIN D) 1000 UNITS tablet Take 1,000 Units by mouth daily.    . memantine (NAMENDA) 10 MG tablet Take 10 mg by mouth daily.      No current facility-administered medications for this encounter.     REVIEW OF SYSTEMS:  Pertinent items are noted in HPI.    PHYSICAL EXAM:  height is 5\' 8"  (1.727 m) and weight is 172 lb 4.8 oz (78.155 kg). His oral temperature is 97.8 F (36.6 C). His blood pressure is 145/75 and his pulse is 79. His respiration is 20.   Alert and oriented to person, place, and situation.  Head and neck examination: Grossly unremarkable.  Nodes: Without palpable cervical or supraclavicular lymphadenopathy.  Chest: Lungs clear.  Abdomen: Without masses organomegaly.  Genitalia: Unremarkable to inspection.  Rectal: The prostate gland is normal in size and is without focal induration or nodularity.  Extremities: Without edema.   LABORATORY DATA:  Lab Results  Component Value Date   WBC 9.8 07/02/2013   HGB 15.4 07/02/2013  HCT 43.0 07/02/2013   MCV 91.5 07/02/2013   PLT 134* 07/02/2013   Lab Results  Component Value Date   NA 139 07/02/2013   K 3.9 07/02/2013   CL 100 07/02/2013   CO2 28 07/02/2013   Lab Results  Component Value Date   ALT 15 07/01/2013   AST 23 07/01/2013   ALKPHOS 42 07/01/2013   BILITOT 0.3 07/01/2013   PSA 9.7 from 08/29/2014.   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  I explained to the patient and his wife that his prognosis is related to his stage, Gleason score, and PSA level.  His PSA level and stage are favorable while his Gleason score of 7 is of intermediate favorability.  Other  prognostic factors include PSA doubling time and disease volume.  He does have low disease volume and his PSA doubling time is favorable.  He has intermediate risk disease.  Dr. Diona Fanti communicated to the patient and his wife a  43% chance of organ confined disease, 55% risk for extracapsular extension, 3% risk for lymph node involvement, and 3% risk for seminal vesicle invasion based on the MSK nomogram.  We discussed management options which include radical prostatectomy/robotic surgery, active surveillance, or radiation therapy.  Radiation therapy options include prostate seed implantation with or without 5 weeks of external beam or 8 weeks of external beam/IMRT.  He is borderline for seed implantation alone because of his risk for extracapsular extension even though 80% of cases with extracapsular extension are within 2-3 mm, and probably would be covered by a high quality seed implant.  However, this cannot be guaranteed.  In most cases one would advise 5 weeks of external beam prior to a seed implant boost in patients who want to undergo seed implantation.  We reviewed seed implantation, and external beam/IMRT in great detail.  We discussed the potential acute and late toxicities of each form of radiation therapy.  We also discussed radiation safety issues with seed implantation.  We discussed placement of 3 gold seed markers for image guidance should he have external beam, and also treatment with a comfortably full bladder to minimize urinary toxicity if he chooses external beam radiation therapy with or without seed implantation.  I'm not sure how his recent cognitive decline factors into his decision-making, and whether or not we should avoid general anesthesia.  The patient and his wife were given information for review.  They Richard think things over and get back in touch with me if they would like to consider radiation therapy.  They may want to speak with Dr. Diona Fanti again.   PLAN: As discussed  above.  The patient and his wife were given my phone number to speak with me after thinking things over.  I spent 60 minutes face to face with the patient and more than 50% of that time was spent in counseling and/or coordination of care.

## 2015-01-20 NOTE — Progress Notes (Signed)
Please see the Nurse Progress Note in the MD Initial Consult Encounter for this patient. 

## 2015-01-26 ENCOUNTER — Encounter: Payer: Self-pay | Admitting: Radiation Oncology

## 2015-01-26 NOTE — Progress Notes (Signed)
CC: Dr. Franchot Gallo, Dr. Tommy Medal  Mrs. Pellow call me today to tell me that her husband wants to proceed with 8 weeks of external beam/IMRT.  I told her that we would have to have 3 gold seed markers placed for image guidance and then he could return here for CT simulation.  We will go ahead and get him scheduled.

## 2015-01-27 ENCOUNTER — Telehealth: Payer: Self-pay | Admitting: *Deleted

## 2015-01-27 NOTE — Telephone Encounter (Signed)
CALLED PATIENT TO INFORM OF GOLD SEED PLACEMENT ON MAY 4- ARRIVAL TIME - 2:45 PM @ DR. DAHLSTEDT'S OFFICE AND HIS SIM ON MAY 16 @ 10 AM, LVM FOR A RETURN CALL

## 2015-02-09 ENCOUNTER — Telehealth: Payer: Self-pay | Admitting: *Deleted

## 2015-02-09 NOTE — Telephone Encounter (Signed)
CALLED JANET Abood WIFE OF Bryon Millette TO INFORM OF GOLD SEED PLACEMENT ON 02-11-15 @ THE UROLOGIST'S OFFICE AND HIS SIM HAS BEEN MOVED UP TO 02-16-15 @ 10 AM WITH DR. Valere Dross, SPOKE WITH MS. Krall AND SHE IS AWARE OF THESE APPTS.

## 2015-02-16 ENCOUNTER — Ambulatory Visit
Admission: RE | Admit: 2015-02-16 | Discharge: 2015-02-16 | Disposition: A | Discharge status: Home / Self Care | Payer: Medicare Other | Source: Ambulatory Visit | Attending: Radiation Oncology | Admitting: Radiation Oncology

## 2015-02-16 DIAGNOSIS — C61 Malignant neoplasm of prostate: Secondary | ICD-10-CM | POA: Diagnosis not present

## 2015-02-16 NOTE — Progress Notes (Signed)
Complex simulation/treatment planning note: The patient was taken to the CT simulator.  A Vac lock immobilization device was constructed.  A red rubber catheter was placed within the rectal vault.  He was catheterized and contrast instilled into the bladder/urethra.  He was then scanned.  I chose an isocenter along the central prostate.  The CT data set was sent to the MIM planning system where contoured his prostate, seminal vesicles, bladder, rectum, and lower rectosigmoid colon.  I'm prescribing 7800 cGy to his prostate PTV which represents the prostate +0.8 cm except for 0.5 cm along the rectum.  I am prescribing 5600 cGy in 40 sessions to his seminal vesicle PTV which are presents his seminal vesicles +0.5 cm.  He is now ready for IMRT simulation/treatment planning.

## 2015-02-20 ENCOUNTER — Encounter: Payer: Self-pay | Admitting: Radiation Oncology

## 2015-02-20 DIAGNOSIS — C61 Malignant neoplasm of prostate: Secondary | ICD-10-CM | POA: Diagnosis not present

## 2015-02-20 NOTE — Progress Notes (Signed)
IMRT simulation/treatment planning note: The patient completed his IMRT treatment planning in the management of his carcinoma the prostate.  IMRT was chosen to decrease the risk for both acute and late bladder and rectal toxicity compared to conventional or 3-D conformal radiation therapy.  Dose volume histograms were obtained for the target structures including the prostate PTV and seminal vesicle PTV in addition to avoidance structures including the bladder, rectum, and femoral heads.  We met our departmental guidelines.  He is being treated with dual ARC VMAT IMRT with 6 MV photons.  I'm prescribing 7800 cGy in 40 sessions to his prostate PTV and 5600 cGy in 40 sessions to his seminal vesicle PTV.

## 2015-02-23 ENCOUNTER — Ambulatory Visit: Payer: Medicare Other | Admitting: Radiation Oncology

## 2015-02-23 DIAGNOSIS — C61 Malignant neoplasm of prostate: Secondary | ICD-10-CM | POA: Diagnosis not present

## 2015-02-25 ENCOUNTER — Ambulatory Visit
Admission: RE | Admit: 2015-02-25 | Discharge: 2015-02-25 | Disposition: A | Discharge status: Home / Self Care | Payer: Medicare Other | Source: Ambulatory Visit | Attending: Radiation Oncology | Admitting: Radiation Oncology

## 2015-02-25 DIAGNOSIS — C61 Malignant neoplasm of prostate: Secondary | ICD-10-CM | POA: Diagnosis not present

## 2015-02-25 NOTE — Progress Notes (Signed)
Chart note: Mr. Tith began his IMRT today in the management of his carcinoma the prostate.  He is being treated with dual ARC VMAT with 2 sets of dynamic MLCs corresponding to one set of IMRT treatment devices [phone]).

## 2015-02-26 ENCOUNTER — Ambulatory Visit
Admission: RE | Admit: 2015-02-26 | Discharge: 2015-02-26 | Disposition: A | Discharge status: Home / Self Care | Payer: Medicare Other | Source: Ambulatory Visit | Attending: Radiation Oncology | Admitting: Radiation Oncology

## 2015-02-26 DIAGNOSIS — C61 Malignant neoplasm of prostate: Secondary | ICD-10-CM | POA: Diagnosis not present

## 2015-02-27 ENCOUNTER — Ambulatory Visit
Admission: RE | Admit: 2015-02-27 | Discharge: 2015-02-27 | Disposition: A | Discharge status: Home / Self Care | Payer: Medicare Other | Source: Ambulatory Visit | Attending: Radiation Oncology | Admitting: Radiation Oncology

## 2015-02-27 DIAGNOSIS — C61 Malignant neoplasm of prostate: Secondary | ICD-10-CM | POA: Diagnosis not present

## 2015-03-02 ENCOUNTER — Ambulatory Visit
Admission: RE | Admit: 2015-03-02 | Discharge: 2015-03-02 | Disposition: A | Discharge status: Home / Self Care | Payer: Medicare Other | Source: Ambulatory Visit | Attending: Radiation Oncology | Admitting: Radiation Oncology

## 2015-03-02 ENCOUNTER — Encounter: Payer: Self-pay | Admitting: Radiation Oncology

## 2015-03-02 VITALS — BP 156/76 | HR 64 | Temp 97.7°F | Ht 68.0 in | Wt 168.4 lb

## 2015-03-02 DIAGNOSIS — C61 Malignant neoplasm of prostate: Secondary | ICD-10-CM | POA: Diagnosis not present

## 2015-03-02 NOTE — Progress Notes (Signed)
Weekly Management Note:  Site: Prostate Current Dose:  780  cGy Projected Dose: 7800  cGy  Narrative: The patient is seen today for routine under treatment assessment. CBCT/MVCT images/port films were reviewed. The chart was reviewed.   Bladder filling is excellent.  No new GU or GI difficulties.  Physical Examination:  Filed Vitals:   03/02/15 1738  BP: 156/76  Pulse: 64  Temp: 97.7 F (36.5 C)  .  Weight: 168 lb 6.4 oz (76.386 kg).  No change.  Impression: Tolerating radiation therapy well.  Plan: Continue radiation therapy as planned.

## 2015-03-03 ENCOUNTER — Ambulatory Visit
Admission: RE | Admit: 2015-03-03 | Discharge: 2015-03-03 | Disposition: A | Discharge status: Home / Self Care | Payer: Medicare Other | Source: Ambulatory Visit | Attending: Radiation Oncology | Admitting: Radiation Oncology

## 2015-03-03 DIAGNOSIS — C61 Malignant neoplasm of prostate: Secondary | ICD-10-CM | POA: Diagnosis not present

## 2015-03-04 ENCOUNTER — Ambulatory Visit
Admission: RE | Admit: 2015-03-04 | Discharge: 2015-03-04 | Disposition: A | Discharge status: Home / Self Care | Payer: Medicare Other | Source: Ambulatory Visit | Attending: Radiation Oncology | Admitting: Radiation Oncology

## 2015-03-04 DIAGNOSIS — C61 Malignant neoplasm of prostate: Secondary | ICD-10-CM | POA: Diagnosis not present

## 2015-03-05 ENCOUNTER — Ambulatory Visit
Admission: RE | Admit: 2015-03-05 | Discharge: 2015-03-05 | Disposition: A | Discharge status: Home / Self Care | Payer: Medicare Other | Source: Ambulatory Visit | Attending: Radiation Oncology | Admitting: Radiation Oncology

## 2015-03-05 DIAGNOSIS — C61 Malignant neoplasm of prostate: Secondary | ICD-10-CM | POA: Diagnosis not present

## 2015-03-06 ENCOUNTER — Ambulatory Visit
Admission: RE | Admit: 2015-03-06 | Discharge: 2015-03-06 | Disposition: A | Discharge status: Home / Self Care | Payer: Medicare Other | Source: Ambulatory Visit | Attending: Radiation Oncology | Admitting: Radiation Oncology

## 2015-03-06 DIAGNOSIS — C61 Malignant neoplasm of prostate: Secondary | ICD-10-CM | POA: Diagnosis not present

## 2015-03-10 ENCOUNTER — Ambulatory Visit
Admission: RE | Admit: 2015-03-10 | Discharge: 2015-03-10 | Disposition: A | Discharge status: Home / Self Care | Payer: Medicare Other | Source: Ambulatory Visit | Attending: Radiation Oncology | Admitting: Radiation Oncology

## 2015-03-10 ENCOUNTER — Encounter: Payer: Self-pay | Admitting: Radiation Oncology

## 2015-03-10 VITALS — BP 144/79 | HR 70 | Temp 97.5°F | Ht 68.0 in | Wt 171.0 lb

## 2015-03-10 DIAGNOSIS — C61 Malignant neoplasm of prostate: Secondary | ICD-10-CM | POA: Diagnosis not present

## 2015-03-10 NOTE — Progress Notes (Signed)
Mr. Corales has received 9 fractions to his pelvis for prostate cancer. reports nocturia x 1.   He reports that voids without straining and empties completely.  He states he received an unknown injection from Dr. Diona Fanti and since he feels "a little off balance."

## 2015-03-10 NOTE — Progress Notes (Signed)
Weekly Management Note:  Site: Prostate Current Dose:  1755  cGy Projected Dose: 7800  cGy  Narrative: The patient is seen today for routine under treatment assessment. CBCT/MVCT images/port films were reviewed. The chart was reviewed.   Bladder filling is excellent.  No new GU or GI difficulties.  Physical Examination:  Filed Vitals:   03/10/15 1711  BP: 144/79  Pulse: 70  Temp: 97.5 F (36.4 C)  .  Weight: 171 lb (77.565 kg).  No change.  Impression: Tolerating radiation therapy well.  Plan: Continue radiation therapy as planned.

## 2015-03-11 ENCOUNTER — Ambulatory Visit
Admission: RE | Admit: 2015-03-11 | Discharge: 2015-03-11 | Disposition: A | Discharge status: Home / Self Care | Payer: Medicare Other | Source: Ambulatory Visit | Attending: Radiation Oncology | Admitting: Radiation Oncology

## 2015-03-11 DIAGNOSIS — C61 Malignant neoplasm of prostate: Secondary | ICD-10-CM | POA: Diagnosis not present

## 2015-03-12 ENCOUNTER — Ambulatory Visit
Admission: RE | Admit: 2015-03-12 | Discharge: 2015-03-12 | Disposition: A | Discharge status: Home / Self Care | Payer: Medicare Other | Source: Ambulatory Visit | Attending: Radiation Oncology | Admitting: Radiation Oncology

## 2015-03-12 DIAGNOSIS — C61 Malignant neoplasm of prostate: Secondary | ICD-10-CM | POA: Diagnosis not present

## 2015-03-13 ENCOUNTER — Ambulatory Visit
Admission: RE | Admit: 2015-03-13 | Discharge: 2015-03-13 | Disposition: A | Discharge status: Home / Self Care | Payer: Medicare Other | Source: Ambulatory Visit | Attending: Radiation Oncology | Admitting: Radiation Oncology

## 2015-03-13 DIAGNOSIS — C61 Malignant neoplasm of prostate: Secondary | ICD-10-CM | POA: Diagnosis not present

## 2015-03-16 ENCOUNTER — Ambulatory Visit
Admission: RE | Admit: 2015-03-16 | Discharge: 2015-03-16 | Disposition: A | Discharge status: Home / Self Care | Payer: Medicare Other | Source: Ambulatory Visit | Attending: Radiation Oncology | Admitting: Radiation Oncology

## 2015-03-16 ENCOUNTER — Encounter: Payer: Self-pay | Admitting: Radiation Oncology

## 2015-03-16 VITALS — BP 147/78 | HR 61 | Temp 98.6°F | Ht 68.0 in | Wt 172.2 lb

## 2015-03-16 DIAGNOSIS — C61 Malignant neoplasm of prostate: Secondary | ICD-10-CM | POA: Diagnosis not present

## 2015-03-16 NOTE — Progress Notes (Signed)
Richard Barr has received 13 fractions to his pelvis for prostate cancer.  He reports nocturia 1-3 times nightly.  Denies any dribbling, but states slow stream with complete emptying.  Denies any rectal irritation.

## 2015-03-16 NOTE — Progress Notes (Signed)
Weekly Management Note:  Site: Prostate Current Dose:  2535  cGy Projected Dose: 7800  cGy  Narrative: The patient is seen today for routine under treatment assessment. CBCT/MVCT images/port films were reviewed. The chart was reviewed.   Bladder filling is excellent.  No new GU or GI difficulties.  Physical Examination:  Filed Vitals:   03/16/15 1658  BP: 147/78  Pulse: 61  Temp: 98.6 F (37 C)  .  Weight: 172 lb 3.2 oz (78.109 kg).  No change.  Impression: Tolerating radiation therapy well.  Plan: Continue radiation therapy as planned.

## 2015-03-17 ENCOUNTER — Ambulatory Visit
Admission: RE | Admit: 2015-03-17 | Discharge: 2015-03-17 | Disposition: A | Discharge status: Home / Self Care | Payer: Medicare Other | Source: Ambulatory Visit | Attending: Radiation Oncology | Admitting: Radiation Oncology

## 2015-03-17 DIAGNOSIS — C61 Malignant neoplasm of prostate: Secondary | ICD-10-CM | POA: Diagnosis not present

## 2015-03-18 ENCOUNTER — Ambulatory Visit
Admission: RE | Admit: 2015-03-18 | Discharge: 2015-03-18 | Disposition: A | Discharge status: Home / Self Care | Payer: Medicare Other | Source: Ambulatory Visit | Attending: Radiation Oncology | Admitting: Radiation Oncology

## 2015-03-18 DIAGNOSIS — C61 Malignant neoplasm of prostate: Secondary | ICD-10-CM | POA: Diagnosis not present

## 2015-03-19 ENCOUNTER — Ambulatory Visit
Admission: RE | Admit: 2015-03-19 | Discharge: 2015-03-19 | Disposition: A | Discharge status: Home / Self Care | Payer: Medicare Other | Source: Ambulatory Visit | Attending: Radiation Oncology | Admitting: Radiation Oncology

## 2015-03-19 DIAGNOSIS — C61 Malignant neoplasm of prostate: Secondary | ICD-10-CM | POA: Diagnosis not present

## 2015-03-20 ENCOUNTER — Ambulatory Visit
Admission: RE | Admit: 2015-03-20 | Discharge: 2015-03-20 | Disposition: A | Discharge status: Home / Self Care | Payer: Medicare Other | Source: Ambulatory Visit | Attending: Radiation Oncology | Admitting: Radiation Oncology

## 2015-03-20 DIAGNOSIS — C61 Malignant neoplasm of prostate: Secondary | ICD-10-CM | POA: Diagnosis not present

## 2015-03-23 ENCOUNTER — Ambulatory Visit
Admission: RE | Admit: 2015-03-23 | Discharge: 2015-03-23 | Disposition: A | Discharge status: Home / Self Care | Payer: Medicare Other | Source: Ambulatory Visit | Attending: Radiation Oncology | Admitting: Radiation Oncology

## 2015-03-23 ENCOUNTER — Encounter: Payer: Self-pay | Admitting: Radiation Oncology

## 2015-03-23 VITALS — BP 166/80 | HR 64 | Resp 16 | Wt 171.8 lb

## 2015-03-23 DIAGNOSIS — C61 Malignant neoplasm of prostate: Secondary | ICD-10-CM

## 2015-03-23 NOTE — Progress Notes (Signed)
   Weekly Management Note:  Outpatient    ICD-9-CM ICD-10-CM   1. 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  CBCT/MVCT images/Port film x-rays were reviewed.  The chart was checked.  Denies pain. Pleasant affect noted. Reports nocturia x 2. Denies dysuria but, does report a manageable warm sensation when he voids. Denies diarrhea. Denies incontinence, urgency or frequency. Reports mild fatigue.   Physical Findings:  weight is 171 lb 12.8 oz (77.928 kg). His blood pressure is 166/80 and his pulse is 64. His respiration is 16.   Wt Readings from Last 3 Encounters:  03/23/15 171 lb 12.8 oz (77.928 kg)  03/16/15 172 lb 3.2 oz (78.109 kg)  03/10/15 171 lb (77.565 kg)   NAD  Impression:  The patient is tolerating radiotherapy.  Plan:  Continue radiotherapy as planned.   This document serves as a record of services personally performed by Eppie Gibson, MD. It was created on her behalf by Arlyce Harman, a trained medical scribe. The creation of this record is based on the scribe's personal observations and the provider's statements to them. This document has been checked and approved by the attending provider. ________________________________   Eppie Gibson, M.D.

## 2015-03-23 NOTE — Progress Notes (Addendum)
Weight and vitals stable. Denies pain. Pleasant affect noted. Reports nocturia x 2. Denies dysuria but, does report a manageable warm sensation when he voids. Denies diarrhea. Denies incontinence, urgency or frequency. Reports mild fatigue.  BP 166/80 mmHg  Pulse 64  Resp 16  Wt 171 lb 12.8 oz (77.928 kg) Wt Readings from Last 3 Encounters:  03/23/15 171 lb 12.8 oz (77.928 kg)  03/16/15 172 lb 3.2 oz (78.109 kg)  03/10/15 171 lb (77.565 kg)

## 2015-03-24 ENCOUNTER — Ambulatory Visit
Admission: RE | Admit: 2015-03-24 | Discharge: 2015-03-24 | Disposition: A | Discharge status: Home / Self Care | Payer: Medicare Other | Source: Ambulatory Visit | Attending: Radiation Oncology | Admitting: Radiation Oncology

## 2015-03-24 DIAGNOSIS — C61 Malignant neoplasm of prostate: Secondary | ICD-10-CM | POA: Diagnosis not present

## 2015-03-25 ENCOUNTER — Ambulatory Visit
Admission: RE | Admit: 2015-03-25 | Discharge: 2015-03-25 | Disposition: A | Discharge status: Home / Self Care | Payer: Medicare Other | Source: Ambulatory Visit | Attending: Radiation Oncology | Admitting: Radiation Oncology

## 2015-03-25 DIAGNOSIS — C61 Malignant neoplasm of prostate: Secondary | ICD-10-CM | POA: Diagnosis not present

## 2015-03-26 ENCOUNTER — Ambulatory Visit
Admission: RE | Admit: 2015-03-26 | Discharge: 2015-03-26 | Disposition: A | Discharge status: Home / Self Care | Payer: Medicare Other | Source: Ambulatory Visit | Attending: Radiation Oncology | Admitting: Radiation Oncology

## 2015-03-26 DIAGNOSIS — C61 Malignant neoplasm of prostate: Secondary | ICD-10-CM | POA: Diagnosis not present

## 2015-03-27 ENCOUNTER — Ambulatory Visit
Admission: RE | Admit: 2015-03-27 | Discharge: 2015-03-27 | Disposition: A | Discharge status: Home / Self Care | Payer: Medicare Other | Source: Ambulatory Visit | Attending: Radiation Oncology | Admitting: Radiation Oncology

## 2015-03-27 DIAGNOSIS — C61 Malignant neoplasm of prostate: Secondary | ICD-10-CM | POA: Diagnosis not present

## 2015-03-30 ENCOUNTER — Encounter: Payer: Self-pay | Admitting: Radiation Oncology

## 2015-03-30 ENCOUNTER — Ambulatory Visit
Admission: RE | Admit: 2015-03-30 | Discharge: 2015-03-30 | Disposition: A | Discharge status: Home / Self Care | Payer: Medicare Other | Source: Ambulatory Visit | Attending: Radiation Oncology | Admitting: Radiation Oncology

## 2015-03-30 VITALS — BP 145/81 | HR 59 | Temp 98.1°F | Ht 68.0 in | Wt 171.0 lb

## 2015-03-30 DIAGNOSIS — C61 Malignant neoplasm of prostate: Secondary | ICD-10-CM | POA: Diagnosis not present

## 2015-03-30 NOTE — Progress Notes (Signed)
Richard Barr has received 23 fractions to his pelvis for prostate cancer.  He states that he has a strange sensation when he is voiding with dribbling at the end of his urinary stream.  Nocturia x 1.  Denies any proctitis, nor change in bowel habits.

## 2015-03-30 NOTE — Progress Notes (Signed)
Weekly Management Note:  Site: Prostate Current Dose:  4485  cGy Projected Dose: 7800  cGy  Narrative: The patient is seen today for routine under treatment assessment. CBCT/MVCT images/port films were reviewed. The chart was reviewed.   Bladder filling is satisfactory.  No new GU or GI difficulties except for a "twinge" sensation at the end of his urinary stream.  This is not particular bothersome.  Physical Examination:  Filed Vitals:   03/30/15 1648  BP: 145/81  Pulse: 59  Temp: 98.1 F (36.7 C)  .  Weight: 171 lb (77.565 kg).  No change.  Impression: Tolerating radiation therapy well.  Plan: Continue radiation therapy as planned.

## 2015-03-31 ENCOUNTER — Ambulatory Visit
Admission: RE | Admit: 2015-03-31 | Discharge: 2015-03-31 | Disposition: A | Discharge status: Home / Self Care | Payer: Medicare Other | Source: Ambulatory Visit | Attending: Radiation Oncology | Admitting: Radiation Oncology

## 2015-03-31 DIAGNOSIS — C61 Malignant neoplasm of prostate: Secondary | ICD-10-CM | POA: Diagnosis not present

## 2015-04-01 ENCOUNTER — Ambulatory Visit
Admission: RE | Admit: 2015-04-01 | Discharge: 2015-04-01 | Disposition: A | Discharge status: Home / Self Care | Payer: Medicare Other | Source: Ambulatory Visit | Attending: Radiation Oncology | Admitting: Radiation Oncology

## 2015-04-01 DIAGNOSIS — C61 Malignant neoplasm of prostate: Secondary | ICD-10-CM | POA: Diagnosis not present

## 2015-04-02 ENCOUNTER — Ambulatory Visit
Admission: RE | Admit: 2015-04-02 | Discharge: 2015-04-02 | Disposition: A | Discharge status: Home / Self Care | Payer: Medicare Other | Source: Ambulatory Visit | Attending: Radiation Oncology | Admitting: Radiation Oncology

## 2015-04-02 DIAGNOSIS — C61 Malignant neoplasm of prostate: Secondary | ICD-10-CM | POA: Diagnosis not present

## 2015-04-03 ENCOUNTER — Ambulatory Visit: Payer: Medicare Other

## 2015-04-06 ENCOUNTER — Ambulatory Visit
Admission: RE | Admit: 2015-04-06 | Discharge: 2015-04-06 | Disposition: A | Discharge status: Home / Self Care | Payer: Medicare Other | Source: Ambulatory Visit | Attending: Radiation Oncology | Admitting: Radiation Oncology

## 2015-04-06 VITALS — BP 161/81 | HR 64 | Temp 98.1°F | Wt 171.0 lb

## 2015-04-06 DIAGNOSIS — C61 Malignant neoplasm of prostate: Secondary | ICD-10-CM | POA: Diagnosis not present

## 2015-04-06 NOTE — Progress Notes (Signed)
Weekly Management Note:  Site: Prostate Current Dose:  5265  cGy Projected Dose: 7800  cGy  Narrative: The patient is seen today for routine under treatment assessment. CBCT/MVCT images/port films were reviewed. The chart was reviewed.   Bladder filling is satisfactory.  No new significant GU or GI difficulties.  Physical Examination:  Filed Vitals:   04/06/15 1642  BP: 161/81  Pulse: 64  Temp: 98.1 F (36.7 C)  .  Weight: 171 lb (77.565 kg).  No change.  Impression: Tolerating radiation therapy well.  Plan: Continue radiation therapy as planned.

## 2015-04-06 NOTE — Progress Notes (Signed)
Weekly assessment of radiation to prostate.Completed 27 of 40 treatments.Urination 2 to 3 times daily.Denies pain or diarrhea.States he just has a "warming" sensation on urination.Has noticed increased fatigue.Missed treatment on Friday 04/03/15 secondary to transportation.

## 2015-04-07 ENCOUNTER — Ambulatory Visit
Admission: RE | Admit: 2015-04-07 | Discharge: 2015-04-07 | Disposition: A | Discharge status: Home / Self Care | Payer: Medicare Other | Source: Ambulatory Visit | Attending: Radiation Oncology | Admitting: Radiation Oncology

## 2015-04-07 DIAGNOSIS — C61 Malignant neoplasm of prostate: Secondary | ICD-10-CM | POA: Diagnosis not present

## 2015-04-08 ENCOUNTER — Ambulatory Visit
Admission: RE | Admit: 2015-04-08 | Discharge: 2015-04-08 | Disposition: A | Discharge status: Home / Self Care | Payer: Medicare Other | Source: Ambulatory Visit | Attending: Radiation Oncology | Admitting: Radiation Oncology

## 2015-04-08 DIAGNOSIS — C61 Malignant neoplasm of prostate: Secondary | ICD-10-CM | POA: Diagnosis not present

## 2015-04-09 ENCOUNTER — Ambulatory Visit
Admission: RE | Admit: 2015-04-09 | Discharge: 2015-04-09 | Disposition: A | Discharge status: Home / Self Care | Payer: Medicare Other | Source: Ambulatory Visit | Attending: Radiation Oncology | Admitting: Radiation Oncology

## 2015-04-09 DIAGNOSIS — C61 Malignant neoplasm of prostate: Secondary | ICD-10-CM | POA: Diagnosis not present

## 2015-04-10 ENCOUNTER — Ambulatory Visit
Admission: RE | Admit: 2015-04-10 | Discharge: 2015-04-10 | Disposition: A | Discharge status: Home / Self Care | Payer: Medicare Other | Source: Ambulatory Visit | Attending: Radiation Oncology | Admitting: Radiation Oncology

## 2015-04-10 DIAGNOSIS — C61 Malignant neoplasm of prostate: Secondary | ICD-10-CM | POA: Diagnosis not present

## 2015-04-14 ENCOUNTER — Encounter: Payer: Self-pay | Admitting: Radiation Oncology

## 2015-04-14 ENCOUNTER — Ambulatory Visit
Admission: RE | Admit: 2015-04-14 | Discharge: 2015-04-14 | Disposition: A | Discharge status: Home / Self Care | Payer: Medicare Other | Source: Ambulatory Visit | Attending: Radiation Oncology | Admitting: Radiation Oncology

## 2015-04-14 VITALS — BP 143/76 | HR 70 | Resp 16 | Wt 171.5 lb

## 2015-04-14 DIAGNOSIS — C61 Malignant neoplasm of prostate: Secondary | ICD-10-CM | POA: Diagnosis not present

## 2015-04-14 NOTE — Progress Notes (Signed)
Weekly Management Note:  Site: Prostate  Current Dose:   6240  cGy Projected Dose:  7800  cGy  Narrative: The patient is seen today for routine under treatment assessment. CBCT/MVCT images/port films were reviewed. The chart was reviewed.     Bladder filling is satisfactory.  No new GU or GI difficulties.  Physical Examination:  Filed Vitals:   04/14/15 1655  BP: 143/76  Pulse: 70  Resp: 16  .  Weight: 171 lb 8 oz (77.792 kg).  No change.  Impression: Tolerating radiation therapy well.  Plan: Continue radiation therapy as planned.

## 2015-04-14 NOTE — Progress Notes (Signed)
Weight and vitals stable. Denies pain. Denies fatigue. Denies diarrhea. Denies hematuria. Reports nocturia x2. Reports mild dysuria. Reports occasional "drip" of urinary leakage.   BP 143/76 mmHg  Pulse 70  Resp 16  Wt 171 lb 8 oz (77.792 kg) Wt Readings from Last 3 Encounters:  04/14/15 171 lb 8 oz (77.792 kg)  04/06/15 171 lb (77.565 kg)  03/30/15 171 lb (77.565 kg)

## 2015-04-15 ENCOUNTER — Ambulatory Visit
Admission: RE | Admit: 2015-04-15 | Discharge: 2015-04-15 | Disposition: A | Discharge status: Home / Self Care | Payer: Medicare Other | Source: Ambulatory Visit | Attending: Radiation Oncology | Admitting: Radiation Oncology

## 2015-04-15 DIAGNOSIS — C61 Malignant neoplasm of prostate: Secondary | ICD-10-CM | POA: Diagnosis not present

## 2015-04-16 ENCOUNTER — Ambulatory Visit
Admission: RE | Admit: 2015-04-16 | Discharge: 2015-04-16 | Disposition: A | Discharge status: Home / Self Care | Payer: Medicare Other | Source: Ambulatory Visit | Attending: Radiation Oncology | Admitting: Radiation Oncology

## 2015-04-16 DIAGNOSIS — C61 Malignant neoplasm of prostate: Secondary | ICD-10-CM | POA: Diagnosis not present

## 2015-04-17 ENCOUNTER — Ambulatory Visit
Admission: RE | Admit: 2015-04-17 | Discharge: 2015-04-17 | Disposition: A | Discharge status: Home / Self Care | Payer: Medicare Other | Source: Ambulatory Visit | Attending: Radiation Oncology | Admitting: Radiation Oncology

## 2015-04-17 DIAGNOSIS — C61 Malignant neoplasm of prostate: Secondary | ICD-10-CM | POA: Diagnosis not present

## 2015-04-20 ENCOUNTER — Ambulatory Visit
Admission: RE | Admit: 2015-04-20 | Discharge: 2015-04-20 | Disposition: A | Discharge status: Home / Self Care | Payer: Medicare Other | Source: Ambulatory Visit | Attending: Radiation Oncology | Admitting: Radiation Oncology

## 2015-04-20 ENCOUNTER — Encounter: Payer: Self-pay | Admitting: Radiation Oncology

## 2015-04-20 VITALS — BP 129/79 | HR 71 | Temp 97.9°F | Ht 68.0 in | Wt 171.2 lb

## 2015-04-20 DIAGNOSIS — C61 Malignant neoplasm of prostate: Secondary | ICD-10-CM | POA: Diagnosis present

## 2015-04-20 NOTE — Progress Notes (Signed)
Richard Barr has received 36 fractions to his pelvis for prostate cancer.  He reports burning sensation - mild when urinating with steady urine stream. Nocturia x 1-2.  Notes more frequent, soft stools. Denies any proctitis.

## 2015-04-20 NOTE — Progress Notes (Signed)
   Weekly Management Note:  outpatient    ICD-9-CM ICD-10-CM   1. 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  CBCT/MVCT images/Port film x-rays were reviewed.  The chart was checked. He is doing well.  Mild dysuria. Soft frequent stool  Physical Findings:  height is 5\' 8"  (1.727 m) and weight is 171 lb 3.2 oz (77.656 kg). His temperature is 97.9 F (36.6 C). His blood pressure is 129/79 and his pulse is 71.   Wt Readings from Last 3 Encounters:  04/20/15 171 lb 3.2 oz (77.656 kg)  04/14/15 171 lb 8 oz (77.792 kg)  04/06/15 171 lb (77.565 kg)   NAD  Impression:  The patient is tolerating radiotherapy.  Plan:  Continue radiotherapy as planned.  F/u in 24mo  ________________________________   Eppie Gibson, M.D.

## 2015-04-21 ENCOUNTER — Ambulatory Visit
Admission: RE | Admit: 2015-04-21 | Discharge: 2015-04-21 | Disposition: A | Discharge status: Home / Self Care | Payer: Medicare Other | Source: Ambulatory Visit | Attending: Radiation Oncology | Admitting: Radiation Oncology

## 2015-04-21 DIAGNOSIS — C61 Malignant neoplasm of prostate: Secondary | ICD-10-CM | POA: Diagnosis not present

## 2015-04-22 ENCOUNTER — Ambulatory Visit
Admission: RE | Admit: 2015-04-22 | Discharge: 2015-04-22 | Disposition: A | Discharge status: Home / Self Care | Payer: Medicare Other | Source: Ambulatory Visit | Attending: Radiation Oncology | Admitting: Radiation Oncology

## 2015-04-22 DIAGNOSIS — C61 Malignant neoplasm of prostate: Secondary | ICD-10-CM | POA: Diagnosis not present

## 2015-04-23 ENCOUNTER — Ambulatory Visit: Payer: Medicare Other

## 2015-04-23 ENCOUNTER — Ambulatory Visit
Admission: RE | Admit: 2015-04-23 | Discharge: 2015-04-23 | Disposition: A | Discharge status: Home / Self Care | Payer: Medicare Other | Source: Ambulatory Visit | Attending: Radiation Oncology | Admitting: Radiation Oncology

## 2015-04-23 DIAGNOSIS — C61 Malignant neoplasm of prostate: Secondary | ICD-10-CM | POA: Diagnosis not present

## 2015-04-24 ENCOUNTER — Ambulatory Visit
Admission: RE | Admit: 2015-04-24 | Discharge: 2015-04-24 | Disposition: A | Discharge status: Home / Self Care | Payer: Medicare Other | Source: Ambulatory Visit | Attending: Radiation Oncology | Admitting: Radiation Oncology

## 2015-04-24 ENCOUNTER — Ambulatory Visit: Payer: Medicare Other

## 2015-04-24 DIAGNOSIS — C61 Malignant neoplasm of prostate: Secondary | ICD-10-CM | POA: Diagnosis not present

## 2015-04-25 ENCOUNTER — Ambulatory Visit: Payer: Medicare Other

## 2015-04-25 ENCOUNTER — Encounter: Payer: Self-pay | Admitting: Radiation Oncology

## 2015-04-25 NOTE — Progress Notes (Signed)
[facility] End of Treatment Note  Name:Richard Barr  Date: 04/25/2015 TFT:[id number] DOB:24-Sep-1941   Status:outpatient    CC: Tommy Medal, MD  , Dr. Franchot Gallo  REFERRING PHYSICIAN: Dr. 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 tolerated his treatment well with no significant GU or GI toxicity by completion of therapy.  He had good bladder filling during his course of therapy.                          PLAN: Routine followup in one month. Patient instructed to call if questions or worsening complaints in interim.

## 2015-05-29 ENCOUNTER — Ambulatory Visit: Admission: RE | Admit: 2015-05-29 | Payer: Medicare Other | Source: Ambulatory Visit | Admitting: Radiation Oncology

## 2015-06-01 ENCOUNTER — Encounter: Payer: Self-pay | Admitting: Radiation Oncology

## 2015-06-02 ENCOUNTER — Encounter: Payer: Self-pay | Admitting: Radiation Oncology

## 2015-06-02 ENCOUNTER — Ambulatory Visit
Admission: RE | Admit: 2015-06-02 | Discharge: 2015-06-02 | Disposition: A | Discharge status: Home / Self Care | Payer: Medicare Other | Source: Ambulatory Visit | Attending: Radiation Oncology | Admitting: Radiation Oncology

## 2015-06-02 VITALS — BP 151/75 | HR 72 | Temp 98.0°F | Ht 68.0 in | Wt 174.0 lb

## 2015-06-02 DIAGNOSIS — C61 Malignant neoplasm of prostate: Secondary | ICD-10-CM

## 2015-06-02 HISTORY — DX: Personal history of irradiation: Z92.3

## 2015-06-02 NOTE — Progress Notes (Addendum)
Mr/ Berline Lopes here for reassessment s/p xrt to his pelvis for prostate cancer.  He reports he is voiding without any difficulty, but he feels a "sensation", not burning during urination. Nocturia x 1-2 which he reports is his norm.  Denies nay rectal irritation, nor diarrhea.

## 2015-06-02 NOTE — Progress Notes (Signed)
CC: Dr. Franchot Gallo  Follow-up note:  Richard Barr returns today approximately 1 month following completion of radiation therapy in the management of his stage TIc intermediate risk adenocarcinoma prostate.  He is doing well from a GU and GI standpoint.  His treatment was well tolerated.  He tells me he will see Dr. Diona Fanti in October.  Physical examination: Alert and oriented. Filed Vitals:   06/02/15 1159  BP: 151/75  Pulse: 72  Temp:    Rectal examination not performed today.  Impression: Satisfactory progress with essentially no sequelae from his radiation therapy.  We discussed follow-up of his prostate cancer with serial PSA determinations.  This will be performed by Dr. Diona Fanti.  Plan: As above.  Follow-up with Dr. Diona Fanti in October.  I've not scheduled Richard Barr for a formal follow-up visit and I ask that Dr. Diona Fanti keep me posted on his progress.

## 2015-08-20 IMAGING — US US RENAL
1 series · 14 of 25 positions shown · non-contrast
Comparison: None.

CLINICAL DATA: Elevated serum creatinine and urinary frequency.
Initial encounter.

EXAM:
RENAL/URINARY TRACT ULTRASOUND COMPLETE

[Series 1: us renal · 0.28mm/px · 14 of 44 slices shown]
[im 1/44]
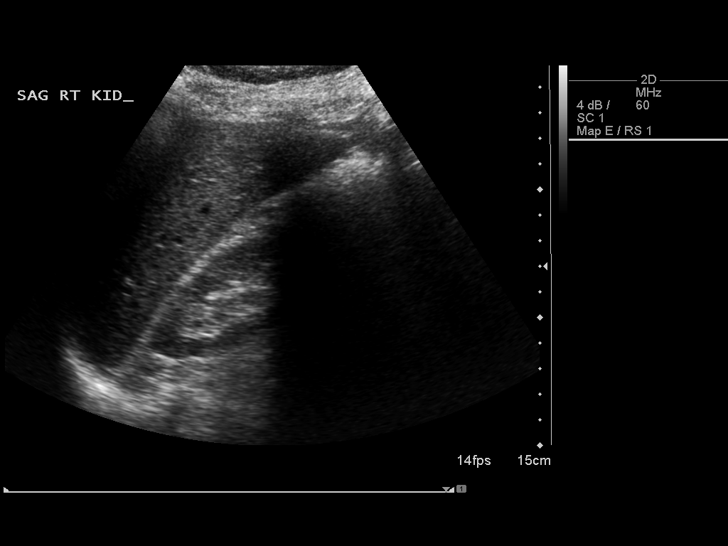
[im 4/44]
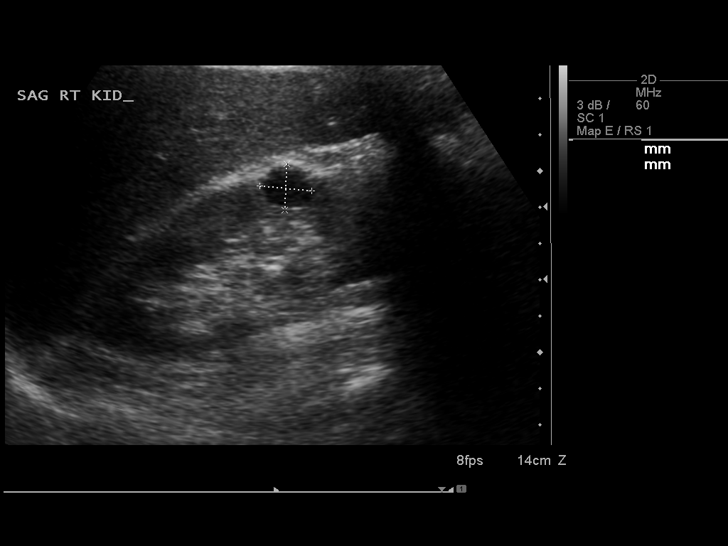
[im 8/44]
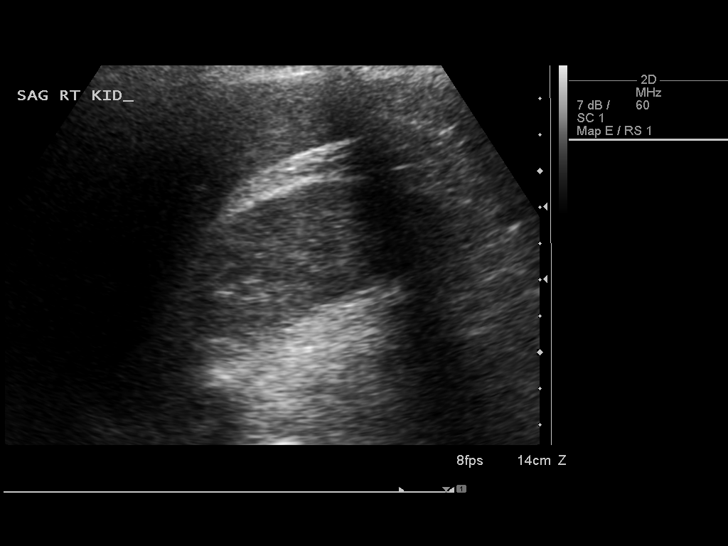
[im 11/44]
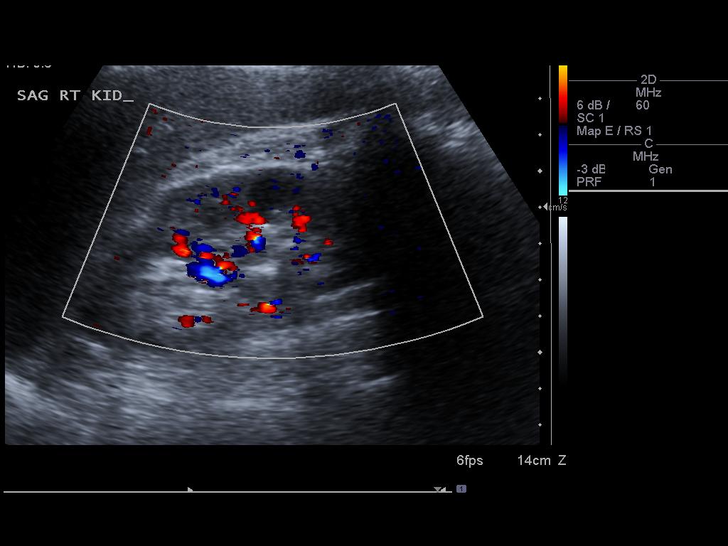
[im 15/44]
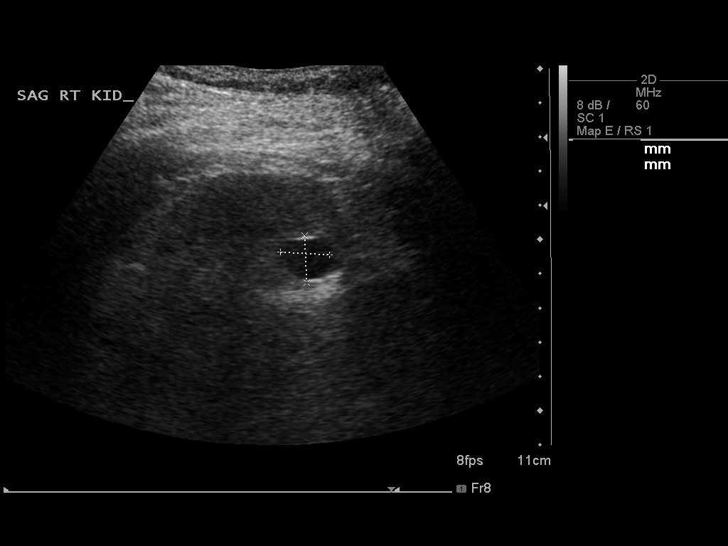
[im 17/44]
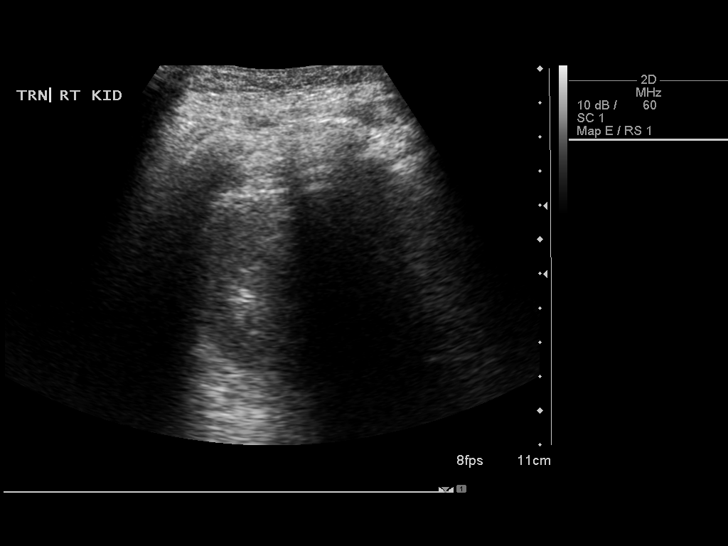
[im 20/44]
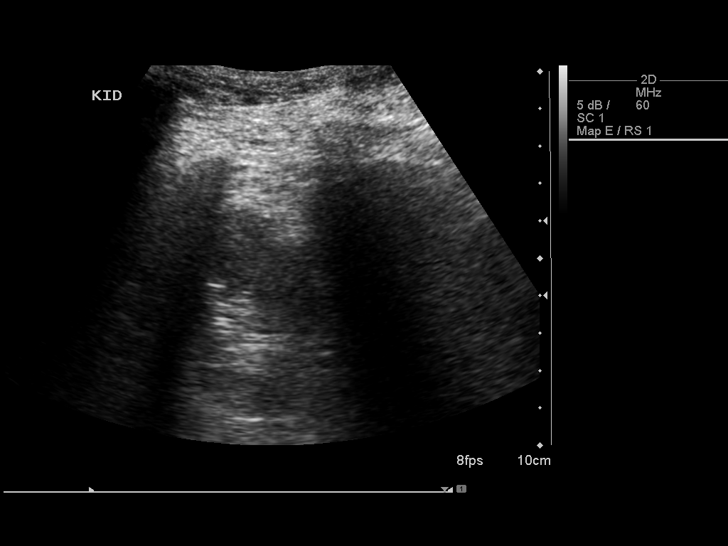
[im 24/44]
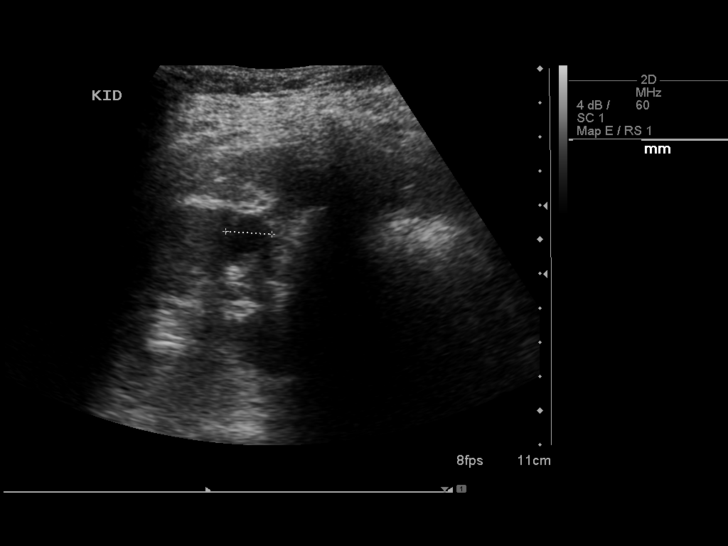
[im 27/44]
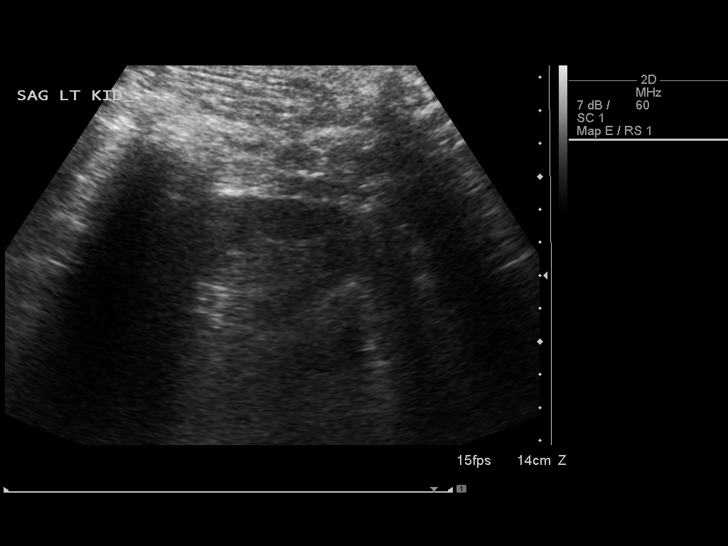
[im 29/44]
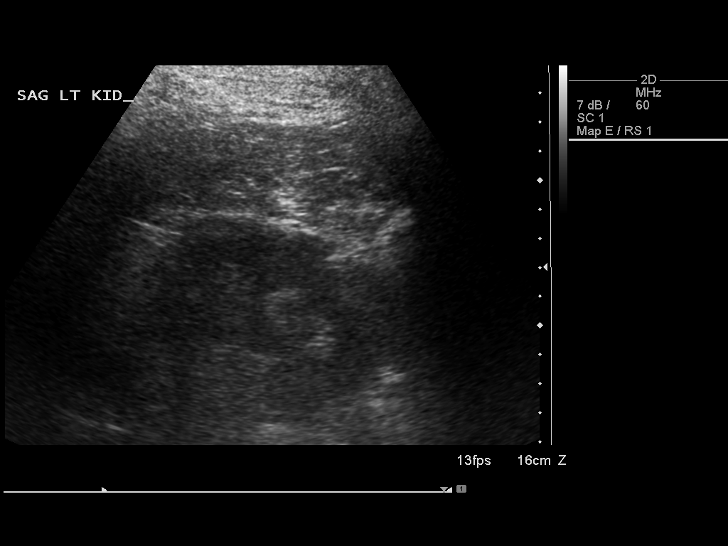
[im 33/44]
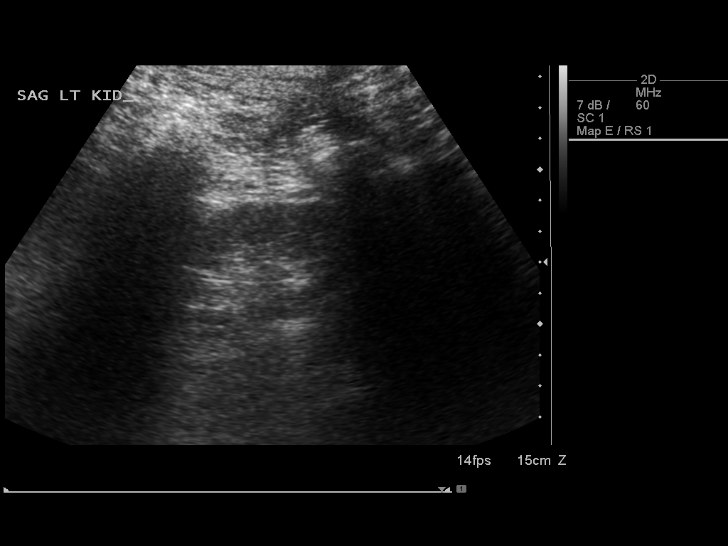
[im 36/44]
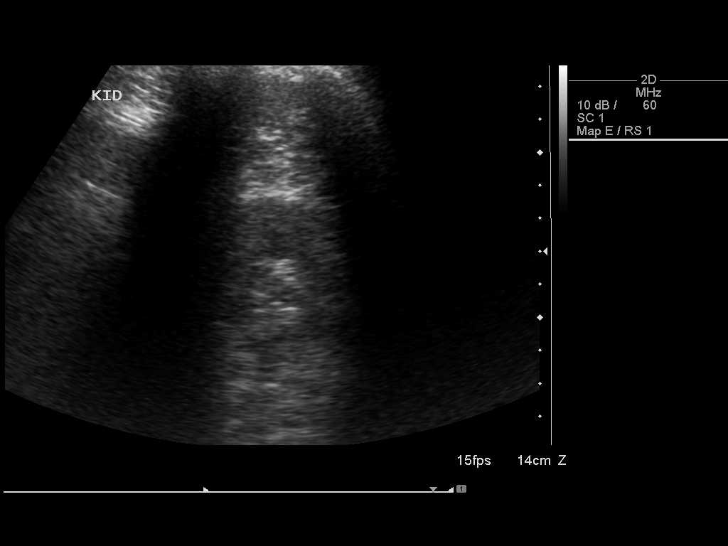
[im 40/44]
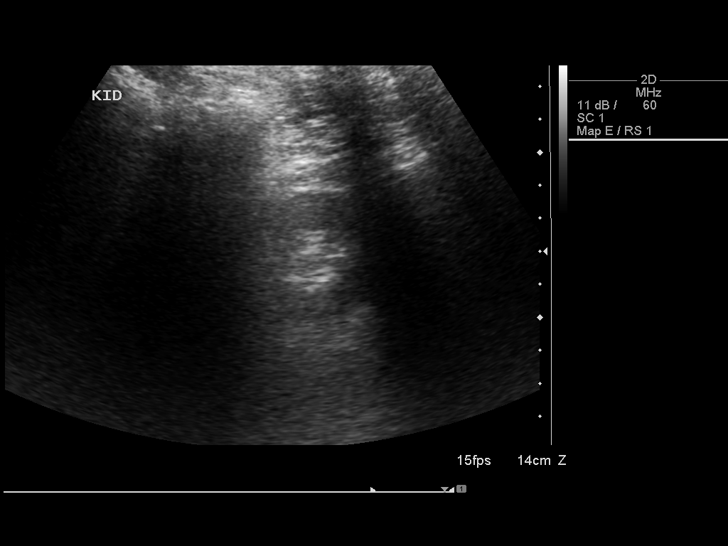
[im 44/44]
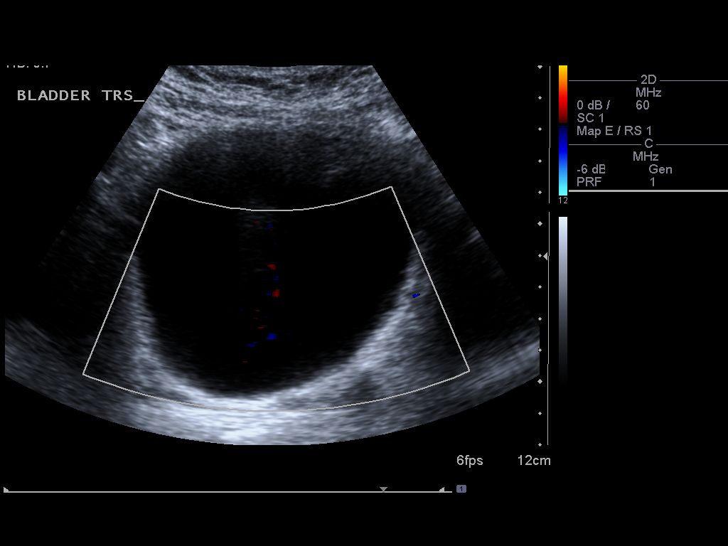

[14 of 25 positions shown; findings below may reference images not displayed]

FINDINGS: Examination is limited by body habitus and bowel gas. The left
kidney is not well visualized.

Right Kidney:

Length: 10.2 cm. Echogenicity is within normal limits. There are
cysts in the interpolar and lower pole regions measuring up to
cm in diameter. No hydronephrosis.

Left Kidney:

Length: 9.1 cm. Echogenicity within normal limits. No mass or
hydronephrosis visualized.

Bladder:

Appears normal for degree of bladder distention.
IMPRESSION: No hydronephrosis or acute findings demonstrated. The left kidney is
relatively small and suboptimally visualized.

## 2021-03-10 DEATH — deceased
